# Patient Record
Sex: Male | Born: 1935 | Race: White | Hispanic: No | Marital: Married
Health system: Midwestern US, Academic
[De-identification: ages and names within clinical notes are randomized; demographics above are authoritative.]

---

## 2017-06-25 LAB — CBC
Lab: 13 — ABNORMAL LOW (ref 14.0–18.0)
Lab: 39 — ABNORMAL LOW (ref 42.0–52.0)
Lab: 4.2 — ABNORMAL LOW (ref 4.70–6.10)
Lab: 6.1
Lab: 93

## 2017-06-25 LAB — COMPREHENSIVE METABOLIC PANEL: Lab: 137 — ABNORMAL HIGH (ref 27.0–31.0)

## 2017-06-25 LAB — THYROID STIMULATING HORMONE-TSH: Lab: 1.4 — ABNORMAL LOW (ref 3.5–5.1)

## 2017-06-25 LAB — LACTIC ACID(LACTATE): Lab: 1.3

## 2017-06-29 LAB — BASIC METABOLIC PANEL
Lab: 10
Lab: 100
Lab: 112 — ABNORMAL HIGH (ref 98–107)
Lab: 141 — ABNORMAL LOW (ref 23–31)
Lab: 15 — ABNORMAL HIGH (ref 0–14)
Lab: 18 — ABNORMAL LOW (ref 23–31)
Lab: 87
Lab: 9.6 — ABNORMAL LOW (ref 3.4–4.8)
Lab: 96

## 2017-06-29 LAB — PSA SCREEN

## 2017-07-28 ENCOUNTER — Encounter: Admit: 2017-07-28 | Discharge: 2017-07-28 | Payer: MEDICARE

## 2017-08-11 ENCOUNTER — Encounter: Admit: 2017-08-11 | Discharge: 2017-08-11 | Payer: MEDICARE

## 2017-08-11 ENCOUNTER — Ambulatory Visit: Admit: 2017-08-11 | Discharge: 2017-08-12 | Payer: MEDICARE

## 2017-08-11 DIAGNOSIS — I2510 Atherosclerotic heart disease of native coronary artery without angina pectoris: ICD-10-CM

## 2017-08-11 DIAGNOSIS — Z8249 Family history of ischemic heart disease and other diseases of the circulatory system: ICD-10-CM

## 2017-08-11 DIAGNOSIS — I739 Peripheral vascular disease, unspecified: Principal | ICD-10-CM

## 2017-08-11 DIAGNOSIS — E039 Hypothyroidism, unspecified: Principal | ICD-10-CM

## 2017-08-11 DIAGNOSIS — C61 Malignant neoplasm of prostate: ICD-10-CM

## 2017-08-11 DIAGNOSIS — I10 Essential (primary) hypertension: ICD-10-CM

## 2017-08-11 MED ORDER — EZETIMIBE 10 MG PO TAB
10 mg | ORAL_TABLET | Freq: Every day | ORAL | 3 refills | Status: AC
Start: 2017-08-11 — End: 2018-07-09

## 2017-08-11 NOTE — Progress Notes
Date of Service: 08/11/2017    Aaron Fuentes is a 82 y.o. male.       HPI      Aaron Fuentes presents for routine annual evaluation of his cardiovascular risks in the setting where he is never had any intervention.  His most clear-cut vascular disease has been in his legs were ultrasound showed occluded distal anterior tib and DP arteries are on the left with fairly satisfactory flow on the right.  He saw vascular surgeon Zeb Comfort 6 years ago it is in [place] and was started on Pletal.  My notes indicated symptoms improved.    Since I last seen Aaron Fuentes is not had hospitalizations for vascular disease.  He is fairly satisfied with his quality of life is not having chest pain.  When I ask him about whether he was still walking 10,000 steps is documented on his Fitbit he told me that he cut back because of pain in his legs that he indicates is in the calves.  States he also has some sciatica with back pain as well but the principal feature he mentioned first was the calf pain that he thinks is a little worse on the right than the left.  He continues to take the Pletal that was started 6 years ago.     08/07/2016    Cholesterol 182   Triglycerides 92   HDL 43   LDL 123 (H)    He denies having typical symptoms suggesting the presence of angina, heart failure, TIA, claudication or arrhythmias.  He specifically denies having any sense of cardiac rate or rhythm abnormality and no sense that his heart is beating in any manner that is different than in the past.          Vitals:    08/11/17 1359 08/11/17 1403   BP: 120/72 120/74   Pulse:  71   Weight: 84.4 kg (186 lb)    Height: 1.778 m (5' 10)      Body mass index is 26.69 kg/m???.     Past Medical History  Patient Active Problem List    Diagnosis Date Noted   ??? Bilateral carotid artery disease (HCC) 01/22/2016     03/22/25 Carotid ultrasound  [facility]: mild disease, uncharged from prior     ??? Extremity atherosclerosis with intermittent claudication (HCC) 02/25/2011     11/21/10 Bilat leg [place] [facility]: Right: Minimal reduction distal flow. Occluded L SFA w/ popliteal reconstitution. Occluded distal anterior Tib and DP arteries. Pletal tried, Dr Yetta Flock w/ improvement.      ??? Coronary artery disease due to lipid rich plaque 04/10/2009     a.  7/96 Dobut echo EF 60%, Poss inf hypo at rest, normal w. dobutamine all regions  b. 12/00-[facility]allium: c/w limited inferior MI, EF 53%, lung/heart count 0.52, slightly high  c. 2001Cath. in [place] - mild single vessel disease, probably RCA,  d. 3/02, [facility] stress echo, 8 min, 85% HR, 55% EF, No ischemia     ??? HTN (hypertension) 04/10/2009   ??? Hyperlipidemia 04/10/2009     a. 9/95 total 225 trig 129 HDL 27    LDL 172 baseline  b. 12/00 total 169 trig 189 HDL 34  LDL 97 Lipitor 10  c. 08/04/07 total 143 trig 80 HDL 36 LDLd  93 Lipitor 10 Niaspan1000  d. 12/21/07 total 136 trig 77 HDL 49, LDL 72 lipitor 10 niaspan 9323  E. 01/24/09 Total 144 trig 80 HDL 42 LDL  80 Lipitor 10, Niaspan 2 gm, increase lipitor to 40      ??? Hypothyroidism 04/10/2009     a. 12/00 TSH elevated 5.9, then 2/01 17.26 w/ free T4 borderline low 0.79     ??? Osteoarthritis 04/10/2009     a. 1999-Left knee replacement     ??? Prostate cancer (HCC) 04/10/2009     a. 9/00-prostatectomy  B. 2013: No recurrence, Dr. Larwance Rote.      ??? FHx: abdominal aortic aneurysm 04/10/2009      a. 08/02/07 Max diameter AA 2.0, normal           Review of Systems   Constitution: Negative.   HENT: Negative.    Eyes: Negative.    Cardiovascular: Negative.    Respiratory: Negative.    Endocrine: Negative.    Hematologic/Lymphatic: Negative.    Skin: Negative.    Musculoskeletal: Negative.    Gastrointestinal: Negative.    Genitourinary: Negative.    Neurological: Negative.    Psychiatric/Behavioral: Negative.    Allergic/Immunologic: Negative.        Physical Exam  General: Patient in no distress, looks healthy. Skin warm and dry, non icteric, Mucous membranes moist  Pupils equal and round  Carotids: no bruits    Thyroid not enlarged.  Neck veins: CVP < 6 normal, no V wave, no HJR     Respiratory: Breathing comfortably. Lungs clear to percussion & auscultation. No rales, rhonchi or wheezing   Cardiac: Regular rhythm. LV impulse not palpable. Normal S1 & S2, Fourth heart sound, no rub or S3. No murmur  Abdomen soft, non-tender, no masses or bruits, No hepatic or aortic enlargement, Nomral bowel sounds.   Femoral arteries: Femoral pulses are reduced bilaterally with soft bifemoral bruits.  Legs/feet: He has no edema.  I do not feel DP or PT pulses on the right but think he has a 1-2+ left DP.  I think is capillary refill is reduced on his right great toe compared to the left with a slightly cooler toes on the right.  Motor: Normal muscle strength.      Cognitive: Good insight. No depression, good cognitive function    Cardiovascular Studies  EKG today shows sinus rhythm left atrial enlargement inferior Q waves leads III and aVF.    Problems Addressed Today  No diagnosis found.    Assessment and Plan   I think Aaron Fuentes has claudication bilaterally with evidence on exam of peripheral arterial disease.  His disease was worse on the left than the right when last checked by ultrasound 6 years ago in [place].  He initially improved with Pletal but now his symptoms are significantly worse than they were when I saw him last year.  Aaron Fuentes is not that concerned about his symptoms as he is accepted the reduction in his walking capacity with without a great deal of distress.  Nevertheless he is interested in pursuing treatment if his exercise capacity could improve and his calf pain with exercise be diminished.    I recommended that he see 1 of our vascular disease specialist Dr. Adella Hare at [facility].  I think noninvasive imaging of some sort would be good at that time.  I mean ask him to set up an appointment with Dr. Chales Abrahams sometime next month and I will contact Dr. Chales Abrahams to determine what not what testing he would recommend for Aaron Fuentes.  Perhaps it is a CT angiogram or perhaps simply ultrasound.  For now I am going to defer  to Dr. Chales Abrahams for the next few diagnostic steps.    I am going to start him on Zetia since his LDL most recently was over 120 on max dose atorvastatin.      NB: This document was prepared within the Epic(TM) EMR using templates developed by the [facility]. It can be accessed online through [facility] feature by clinicians at other Epic institutions.  The free text in this document, was generated through Dragon(TM) software.  Editing and proofreading were done by the author of this document Dr. Mable Paris MD, [facility] principally at the point of care.  In spite of the author's best effort to identify every error introduced by voice to text dictation, some errors that may represent misspelling or misstatements of what was dictated may persist.  If there are questions about content in this document please contact Dr. Hale Bogus.    The written information I provided Aaron Fuentes at the conclusion of today's encounter is as  follows:   Patient Instructions   Your leg pain is not terrifically severe but it is limiting her activities.  Last year I was pleased to know that your walking 10,000 steps a day.  I am dismayed to hear that you are not able to do that this year because of the calf pain.  I believe that Pain that you are having is due to severely blocked arteries to your feet.  You been on Pletal for a number of years which is the only medicine that helps reduce those symptoms.  Now you are having symptoms while on Pletal it is time to move forward with better testing and possible treatment with balloon catheters or even surgery.  It looks to me like the blood flow to your feet is less than it should be so I want to make sure that we have done everything possible as soon as we can to keep the blood flow going to your feet.    Dr. Harley Alto at [facility] is the best Dr. I know up to for you to see about this.  I want you to set up an appointment with him in late October or early November which is about when he will have an opening.  I am not sure what testing he will want the will try to set that up to occur at the same time.    In the meantime I want you to add Zetia 10 mg daily to your program for reducing your cholesterol.  Do not stop your other medication for a cholesterol control atorvastatin when you start the Zetia.  It is time for you to take both.   That should get your bad cholesterol down further and keep the arteries from becoming more blocked.      Your blood pressure looks good so that is the only change I am making in your medications.  Please do continue to stay active.  I will see you back in [place] in about 3 months.  I think you will need to meet Dr. Chales Abrahams before then to sort out what can be done for your leg pain that is impairing your walking capacity.    I think it would be good for you to return for a recheck in six months. I can see you sooner if needed.   Call in if you have problems or questions.   Marissa Nestle, MD  Current Medications (including today's revisions)  ??? atorvastatin (LIPITOR) 80 mg tablet TAKE 1 TABLET EVERY DAY   ??? cilostazol(+) (PLETAL) 100 mg tablet Take 100 mg by mouth twice daily.     ??? diclofenac sodium (PENNSAID) 2 % sopk Apply  topically to affected area.   ??? ferrous gluconate 324 mg (37.5 mg iron) tab Take 324 mg by mouth daily with breakfast.   ??? levoFLOXacin (LEVAQUIN) 500 mg tablet Take 1 tablet by mouth daily.   ??? Levothyroxine 125 mcg Cap Take 1 Tab by mouth Daily.   ??? meloxicam,+, (MOBIC) 7.5 mg tablet Take 15 mg by mouth daily.   ??? naproxen sodium (ALEVE) 220 mg capsule Take 1 tablet by mouth twice daily. Take with food.   ??? prednisone (DELTASONE) 20 mg tablet Take 1 tablet by mouth daily. ??? Ramipril (ALTACE) 10 mg Tab Take 1 Tab by mouth Daily.   ??? VENTOLIN HFA 90 mcg/actuation inhaler Take 2 puffs by mouth twice daily.

## 2017-08-18 ENCOUNTER — Encounter: Admit: 2017-08-18 | Discharge: 2017-08-18 | Payer: MEDICARE

## 2017-08-18 DIAGNOSIS — I70219 Atherosclerosis of native arteries of extremities with intermittent claudication, unspecified extremity: ICD-10-CM

## 2017-08-18 DIAGNOSIS — I739 Peripheral vascular disease, unspecified: ICD-10-CM

## 2017-08-18 DIAGNOSIS — I2510 Atherosclerotic heart disease of native coronary artery without angina pectoris: Principal | ICD-10-CM

## 2017-08-18 DIAGNOSIS — M79609 Pain in unspecified limb: ICD-10-CM

## 2017-08-18 DIAGNOSIS — M79661 Pain in right lower leg: ICD-10-CM

## 2017-08-18 NOTE — Telephone Encounter
When we see him in November please do complete ABI and arterial duplex study. thanks

## 2017-09-30 ENCOUNTER — Ambulatory Visit: Admit: 2017-09-30 | Discharge: 2017-10-01 | Payer: MEDICARE

## 2017-09-30 ENCOUNTER — Encounter: Admit: 2017-09-30 | Discharge: 2017-09-30 | Payer: MEDICARE

## 2017-09-30 DIAGNOSIS — I10 Essential (primary) hypertension: ICD-10-CM

## 2017-09-30 DIAGNOSIS — E039 Hypothyroidism, unspecified: Principal | ICD-10-CM

## 2017-09-30 DIAGNOSIS — W19XXXA Unspecified fall, initial encounter: Principal | ICD-10-CM

## 2017-09-30 DIAGNOSIS — Z8249 Family history of ischemic heart disease and other diseases of the circulatory system: ICD-10-CM

## 2017-09-30 DIAGNOSIS — I2510 Atherosclerotic heart disease of native coronary artery without angina pectoris: ICD-10-CM

## 2017-09-30 DIAGNOSIS — I70219 Atherosclerosis of native arteries of extremities with intermittent claudication, unspecified extremity: ICD-10-CM

## 2017-09-30 DIAGNOSIS — C61 Malignant neoplasm of prostate: ICD-10-CM

## 2017-09-30 NOTE — Progress Notes
Date of Service: 09/30/2017    Aaron Fuentes is a 81 y.o. male.       HPI     I had the pleasure of seeing Aaron Fuentes today for evaluation of a recent fall.  Aaron Fuentes has a history of bilateral carotid artery disease, coronary artery disease due to lipid rich plaque hypertension, hyperlipidemia, hypothyroidism, osteoarthritis, prostate cancer post prostatectomy in 2000, COPD with prior tobacco use, and a family history of abdominal aortic aneurysm.  He most recently followed up with Dr. Hale Bogus August 11, 2017 and at that time he noted that for the previous 6 years taking Pletal therapy his peripheral vascular disease in his legs was remaining stable.  He had not been walking as much as he had in the past due to pain in his calves that was a little worse on the right than the left.  He was asked to follow-up with an ABI and arterial duplex of the legs if indicated indicated.  He was then to follow-up with her with Dr. Harley Alto for vascular evaluation.  Due to his elevated lipid profile Zetia was added to his atorvastatin 80 mg daily.    Mrs. Aaron Fuentes called our office to let us know that Aaron Fuentes fell recently and an appointment was scheduled to evaluate him.    Aaron Fuentes presents to the office today along with his wife.  She indicates that Aaron Fuentes's memory has not been good for quite some time.  She tells me that probably 3 months ago he was bent over to pick up some potatoes and lost his balance and fell forward.  He received a small bruise on his forehead.  About 10 days ago Aaron Fuentes's brother passed away from lung cancer.  They went to his funeral.  Therin was walking up the steps of the church and tripped and fell forward.  He received a few scratches but no other injuries.  Aaron Fuentes tells me that he has not had any dizziness or lightheadedness.  He tells me the first time he just lost his balance and the second time he tripped on the church steps.  His wife states that he was very stressed at the funeral and was probably dehydrated as he was not thinking about drinking liquids.    Aaron Fuentes does have a 20-25-year history of smoking and has COPD.  His heart rate today has ranged from 92-109.  He is also on prednisone 20 mg daily.  He does have an occasional cough that is nonproductive.  He has sock top edema bilaterally that is 1+.    Assessment and plan    1.  2 falls within the last 3 months.  His first fall was about 3 months ago when he was bending over to pick up potatoes.  He is quite sure he lost his balance.  He absolutely denies feeling lightheaded or dizzy.  The second fall was walking up steps about 10 days ago and he says he tripped on the step.  Again he absolutely denies feeling lightheaded or dizzy.  He does run a rather low blood pressure at 104/58.  He has been reminded to make sure he stays well hydrated.  If he does have any episodes of feeling lightheaded or dizzy he is been asked to call our office back.    2.  Hypertension treated with ramipril 10 mg daily.  Also with his peripheral vascular disease we hope to keep the ramipril on board.  Although his blood pressure is on the low  side he absolutely denies feeling lightheaded or dizzy.  If he does develop lightheadedness or dizziness we will decrease this dose.    3.  Peripheral vascular disease.  He is scheduled for ABIs and if abnormal follow-up arterial duplex of the legs on 09/18/2017 at her [facility] office.  He has been given instructions for this procedure today.    4.  Elevated heart rate.  His heart rates are running 92-109 today.  This may be due to his underlying COPD and prednisone.    We spent approximately 15 minutes of his 25-minute office visit discussing peripheral vascular disease and potential treatment.  We also discussed COPD causes and treatment.  We also discussed the importance of safety measures to prevent falls.    Thank you for the opportunity to participate in the care of this patient. Please feel free to call us if you have any questions or concerns    Loraine Leriche, APRN-C  DRB             Vitals:    09/30/17 1315 09/30/17 1321   BP: 108/58 104/58   Pulse:  109   Weight: 88 kg (194 lb)    Height: 1.778 m (5' 10)      Body mass index is 27.84 kg/m???.     Past Medical History  Patient Active Problem List    Diagnosis Date Noted   ??? Bilateral carotid artery disease (HCC) 01/22/2016     03/22/25 Carotid ultrasound  [facility]: mild disease, uncharged from prior     ??? Extremity atherosclerosis with intermittent claudication (HCC) 02/25/2011     11/21/10 Bilat leg [place] [facility]: Right: Minimal reduction distal flow. Occluded L SFA w/ popliteal reconstitution. Occluded distal anterior Tib and DP arteries. Pletal tried, Dr Yetta Flock w/ improvement.      ??? Coronary artery disease due to lipid rich plaque 04/10/2009     a.  7/96 Dobut echo EF 60%, Poss inf hypo at rest, normal w. dobutamine all regions  b. 12/00-[facility]ium: c/w limited inferior MI, EF 53%, lung/heart count 0.52, slightly high  c. 2001Cath. in [place] - mild single vessel disease, probably RCA,  d. 3/02, [facility] stress echo, 8 min, 85% HR, 55% EF, No ischemia     ??? HTN (hypertension) 04/10/2009   ??? Hyperlipidemia 04/10/2009     a. 9/95 total 225 trig 129 HDL 27    LDL 172 baseline  b. 12/00 total 169 trig 189 HDL 34  LDL 97 Lipitor 10  c. 08/04/07 total 143 trig 80 HDL 36 LDLd  93 Lipitor 10 Niaspan1000  d. 12/21/07 total 136 trig 77 HDL 49, LDL 72 lipitor 10 niaspan 9604  E. 01/24/09 Total 144 trig 80 HDL 42 LDL 80 Lipitor 10, Niaspan 2 gm, increase lipitor to 40      ??? Hypothyroidism 04/10/2009     a. 12/00 TSH elevated 5.9, then 2/01 17.26 w/ free T4 borderline low 0.79     ??? Osteoarthritis 04/10/2009     a. 1999-Left knee replacement     ??? Prostate cancer (HCC) 04/10/2009     a. 9/00-prostatectomy  B. 2013: No recurrence, Dr. Larwance Rote.      ??? FHx: abdominal aortic aneurysm 04/10/2009      a. 08/02/07 Max diameter AA 2.0, normal Review of Systems   Constitution: Positive for weight gain.   HENT: Negative.    Eyes: Negative.    Cardiovascular: Positive for claudication.   Respiratory: Positive for cough, shortness  of breath, snoring and wheezing.    Endocrine: Negative.    Hematologic/Lymphatic: Negative.    Skin: Negative.    Musculoskeletal: Positive for arthritis, back pain, falls, joint pain, joint swelling, muscle cramps, muscle weakness, myalgias and stiffness.   Gastrointestinal: Negative.    Genitourinary: Negative.    Neurological: Positive for difficulty with concentration and loss of balance.   Psychiatric/Behavioral: Positive for memory loss.   Allergic/Immunologic: Negative.    He denies having chest pain, palpitations, PND, orthopnea, lightheadedness, dizziness, pre-syncope, syncope,  wheezing,  sputum production, or hemoptysis, or edema. He uses no tobacco products.      Physical Exam       General Appearance: resting comfortably, no acute distress  Skin: warm, moist, no ulcers or xanthomas, few small healing scratches on backs of hands/wrists  Eyes: conjunctivae and lids normal, pupils are equal and round  Lips & Oral Mucosa: no pallor or cyanosis  Ear, Nose, Throat: No deformities  Neck Veins: neck veins are flat, neck veins are not distended  Thyroid: no nodules, masses, tenderness or enlargement  Chest Inspection: chest is normal in appearance  Respiratory Effort: breathing is unlabored, no respiratory distress  Auscultation/Percussion: lungs clear to auscultation, no rales, rhonchi, or wheezing  PMI: PMI not enlarged or displaced  Cardiac Rhythm: regular rhythm and normal rate  Cardiac Auscultation: Normal S1 & S2, no S3 or S4, no rub  Murmurs: no cardiac murmurs   Carotid Arteries: normal carotid upstroke bilaterally, no bruits  Pedal Pulses: absent symmetric pedal pulses, left foot sl blue, decreased capillary refill both toes  Lower Extremity Edema: bilateral sock top edema, 1+ Abdominal Exam: soft, non-tender, no masses, bowel sounds normal  Abdominal Aorta: nonpalpable abdominal aorta; no abdominal bruits  Liver & Spleen: no organomegaly  Gait & Station: normal balance and gait  Muscle Strength: normal strength and tone  Neurologic Exam: neurological assessment grossly intact  Orientation: oriented to time, place and person  Affect & Mood: appropriate and sustained affect  Other: moves all extremities              Problems Addressed Today  No diagnosis found.                  Current Medications (including today's revisions)  ??? atorvastatin (LIPITOR) 80 mg tablet TAKE 1 TABLET EVERY DAY   ??? cilostazol(+) (PLETAL) 100 mg tablet Take 100 mg by mouth twice daily.     ??? diclofenac sodium (PENNSAID) 2 % sopk Apply  topically to affected area.   ??? ezetimibe (ZETIA) 10 mg tablet Take one tablet by mouth daily.   ??? ferrous gluconate 324 mg (37.5 mg iron) tab Take 324 mg by mouth daily with breakfast.   ??? levoFLOXacin (LEVAQUIN) 500 mg tablet Take 1 tablet by mouth daily.   ??? Levothyroxine 125 mcg Cap Take 1 Tab by mouth Daily.   ??? meloxicam,+, (MOBIC) 7.5 mg tablet Take 15 mg by mouth daily.   ??? naproxen sodium (ALEVE) 220 mg capsule Take 1 tablet by mouth twice daily. Take with food.   ??? prednisone (DELTASONE) 20 mg tablet Take 1 tablet by mouth daily.   ??? Ramipril (ALTACE) 10 mg Tab Take 1 Tab by mouth Daily.   ??? VENTOLIN HFA 90 mcg/actuation inhaler Take 2 puffs by mouth twice daily.

## 2017-10-07 ENCOUNTER — Ambulatory Visit: Admit: 2017-10-07 | Discharge: 2017-10-08 | Payer: MEDICARE

## 2017-10-07 DIAGNOSIS — I739 Peripheral vascular disease, unspecified: ICD-10-CM

## 2017-10-07 DIAGNOSIS — I70219 Atherosclerosis of native arteries of extremities with intermittent claudication, unspecified extremity: ICD-10-CM

## 2017-10-07 DIAGNOSIS — M79661 Pain in right lower leg: Principal | ICD-10-CM

## 2017-10-07 DIAGNOSIS — M79609 Pain in unspecified limb: ICD-10-CM

## 2017-10-07 DIAGNOSIS — I2510 Atherosclerotic heart disease of native coronary artery without angina pectoris: ICD-10-CM

## 2017-10-16 ENCOUNTER — Encounter: Admit: 2017-10-16 | Discharge: 2017-10-16 | Payer: MEDICARE

## 2017-10-16 NOTE — Telephone Encounter
Message sent to scheduling to work patient in on 12/18 at [facility] with [place].

## 2017-10-16 NOTE — Telephone Encounter
-----   Message from Harle Battiest, APRN-C sent at 10/16/2017  6:41 AM CST -----  Regarding: RE: Question  Kaitlin,  Although he was not having toe or foot pain, he did occasionally have some calf pain previously. If you can get him in to see KAG sooner that would be best.  Thank you,  Collie Siad  ----- Message -----  From: Aundria Rud, RN  Sent: 10/15/2017   9:46 AM  To: Aundria Rud, RN, Harle Battiest, APRN-C  Subject: Question                                         Carmel [place],    This patient is scheduled with Dr. Lyndel Safe on 11/19/17.      Dr. Lyndel Safe would like to know if he is having rest pain in toes or foot wounds.  If yes, then we will try to get him in earlier. If no, we will keep him on for January as then there is no urgency.      Let me know! Thanks!     Kait    ----- Message -----  From: Silvestre Moment, MD  Sent: 10/12/2017   8:06 AM  To: Erle Crocker, MD, Aundria Rud, RN, #    Thank you for the message,   Collie Siad, if Dr. Lyndel Safe is suggesting that he is worked in before 5 weeks from now, will someone contact him?     ----- Message -----  From: Erle Crocker, MD  Sent: 10/09/2017   2:25 PM  To: Silvestre Moment, MD, Aundria Rud, RN, #    Eduard Clos and Collie Siad    This patient's duplex studies show quite significant disease that is alos multilevel and diffuse.    He cancelled an appointment with me earlier this month but has another one in early January.    If he does not have critical limb ischemia ( rest pain in toes or foot wounds) then there is certainly no urgency to be seen but just wanted to make sure as we can always work patients in.    Thanks    [employer]

## 2017-10-20 ENCOUNTER — Encounter: Admit: 2017-10-20 | Discharge: 2017-10-21 | Payer: MEDICARE

## 2017-10-20 DIAGNOSIS — R69 Illness, unspecified: Principal | ICD-10-CM

## 2017-11-03 ENCOUNTER — Ambulatory Visit: Admit: 2017-11-03 | Discharge: 2017-11-04 | Payer: MEDICARE

## 2017-11-03 ENCOUNTER — Encounter: Admit: 2017-11-03 | Discharge: 2017-11-03 | Payer: MEDICARE

## 2017-11-03 DIAGNOSIS — Z8249 Family history of ischemic heart disease and other diseases of the circulatory system: ICD-10-CM

## 2017-11-03 DIAGNOSIS — I70213 Atherosclerosis of native arteries of extremities with intermittent claudication, bilateral legs: Principal | ICD-10-CM

## 2017-11-03 DIAGNOSIS — I739 Peripheral vascular disease, unspecified: Principal | ICD-10-CM

## 2017-11-03 DIAGNOSIS — I10 Essential (primary) hypertension: ICD-10-CM

## 2017-11-03 DIAGNOSIS — Z01812 Encounter for preprocedural laboratory examination: ICD-10-CM

## 2017-11-03 DIAGNOSIS — E039 Hypothyroidism, unspecified: Principal | ICD-10-CM

## 2017-11-03 DIAGNOSIS — I2510 Atherosclerotic heart disease of native coronary artery without angina pectoris: ICD-10-CM

## 2017-11-03 DIAGNOSIS — C61 Malignant neoplasm of prostate: ICD-10-CM

## 2017-11-03 MED ORDER — ASPIRIN 325 MG PO TAB
325 mg | Freq: Once | ORAL | 0 refills | Status: CN
Start: 2017-11-03 — End: ?

## 2017-11-03 MED ORDER — SODIUM CHLORIDE 0.9 % IV SOLP
250 mL | INTRAVENOUS | 0 refills | Status: CN | PRN
Start: 2017-11-03 — End: ?

## 2017-11-03 NOTE — Progress Notes
Date of Service: 11/03/2017    Aaron Fuentes is a 81 y.o. male.       HPI     Aaron Fuentes comes today to establish vascular care.  He is a remarkably pleasant 32 year old gentleman who follows with my colleague Dr. Marissa Nestle.  He is seeing me today for worsening lower extremity discomfort consistent with vascular.  He and his wife both agree that the patient is getting progressively able to do less and less.  A year ago they would easily walk a mile when the claudication would set in but now they have stopped walking very much and he has to stop after 1 or 2 blocks because of discomfort.  He describes discomfort in both calves and right hip when he walks.  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 Yetta Flock in [place].  At that time Pletal brought about some relief and conservative management was pursued.  More recently    For worsening symptoms the patient underwent resting and exercise ABI and duplex sonography requested by Dr. Hale Bogus.  His resting ABI on the right side was 0.6 and became 0 after limited amount of walking on the treadmill. Left leg resting ABI was 0.74 and reduced to 0.63 on activity.  He had severe stenosis in the right  femoral artery the monophasic waveforms even above that suggest that there might also be an iliac artery stenosis that is being missed.  The left SFA is occluded.  The patient has no nonhealing ulcers or sores or rest discomfort to suggest critical limb ischemia      Past medical history:    1. Peripheral arterial disease with severe stenosis of the right CFA and occlusion of the left SFA  2. Hypothyroidism  3. Left knee replacement in 1999  4. Prostatectomy for prostate cancer in 2000  5. Hypertension  6. Mild CAD without prior revascularization    Social history: The patient is not a current smoker and does not use alcohol in any significant quantity Family history: History of abdominal aortic aneurysm           Vitals:    11/03/17 1457 11/03/17 1506   BP: 126/70 130/70   Pulse: 80    SpO2: 95%    Weight: 85.7 kg (189 lb)    Height: 1.778 m (5' 10)      Body mass index is 27.12 kg/m???.     Past Medical History  Patient Active Problem List    Diagnosis Date Noted   ??? Falls 09/30/2017   ??? Bilateral carotid artery disease (HCC) 01/22/2016     03/22/25 Carotid ultrasound  [facility]: mild disease, uncharged from prior     ??? Extremity atherosclerosis with intermittent claudication (HCC) 02/25/2011     11/21/10 Bilat leg [place] [facility]: Right: Minimal reduction distal flow. Occluded L SFA w/ popliteal reconstitution. Occluded distal anterior Tib and DP arteries. Pletal tried, Dr Yetta Flock w/ improvement.      ??? Coronary artery disease due to lipid rich plaque 04/10/2009     a.  7/96 Dobut echo EF 60%, Poss inf hypo at rest, normal w. dobutamine all regions  b. 12/00-[facility] thallium: c/w limited inferior MI, EF 53%, lung/heart count 0.52, slightly high  c. 2001Cath. in [place] - mild single vessel disease, probably RCA,  d. 3/02, [facility] stress echo, 8 min, 85% HR, 55% EF, No ischemia     ???  HTN (hypertension) 04/10/2009   ??? Hyperlipidemia 04/10/2009     a. 9/95 total 225 trig 129 HDL 27    LDL 172 baseline  b. 12/00 total 169 trig 189 HDL 34  LDL 97 Lipitor 10  c. 08/04/07 total 143 trig 80 HDL 36 LDLd  93 Lipitor 10 Niaspan1000  d. 12/21/07 total 136 trig 77 HDL 49, LDL 72 lipitor 10 niaspan 1610  E. 01/24/09 Total 144 trig 80 HDL 42 LDL 80 Lipitor 10, Niaspan 2 gm, increase lipitor to 40      ??? Hypothyroidism 04/10/2009     a. 12/00 TSH elevated 5.9, then 2/01 17.26 w/ free T4 borderline low 0.79     ??? Osteoarthritis 04/10/2009     a. 1999-Left knee replacement     ??? Prostate cancer (HCC) 04/10/2009     a. 9/00-prostatectomy  B. 2013: No recurrence, Dr. Larwance Rote.      ??? FHx: abdominal aortic aneurysm 04/10/2009      a. 08/02/07 Max diameter AA 2.0, normal Review of Systems   Constitution: Negative.   HENT: Positive for hearing loss.    Eyes: Negative.    Cardiovascular: Positive for claudication, dyspnea on exertion and leg swelling.   Respiratory: Positive for cough, shortness of breath, snoring and wheezing.    Endocrine: Negative.    Hematologic/Lymphatic: Bruises/bleeds easily.   Skin: Negative.    Musculoskeletal: Positive for arthritis, back pain, falls, muscle cramps and stiffness.   Gastrointestinal: Negative.    Genitourinary: Negative.    Neurological: Positive for loss of balance.   Psychiatric/Behavioral: Negative.    Allergic/Immunologic: Negative.        Physical Exam    Physical Exam  General Appearance: no acute distress   Skin: warm, moist, no ulcers   HENT: unremarkable   Eyes; Pupils are round and reactive. No icterus  Neck Veins: neck veins are flat, neck veins are not distended   Carotid Arteries: normal carotid upstroke bilaterally, no bruits   Chest Inspection: chest is normal in appearance   Auscultation/Percussion: lungs clear to auscultation, no rales, rhonchi, or wheezing   Cardiac Rhythm: regular rhythm and normal rate   Cardiac Auscultation: Normal S1 & S2, no S3 or S4, no rub   Murmurs: no cardiac murmurs   Extremities: Pulses:  Pulses: (Scale 4= normal, 0= absent)    Right   Femoral: 4+, Dorsalis Pedis: 0+,  Posterior Tibial: 0+  Left   Femoral: 4+, Dorsalis Pedis: 0+,  Posterior Tibial: 0+   No pedal edema.  Abdominal Exam: soft, non-tender, no masses, bowel sounds normal   Liver & Spleen: no organomegaly   Neurologic Exam: neurological assessment grossly intact        Problems Addressed Today  Encounter Diagnoses   Name Primary?   ??? Extremity atherosclerosis with intermittent claudication (HCC) Yes       Assessment and Plan     In summary the patient has worsening bilateral vascular claudication and abnormal ABI and duplex studies as detailed above.  I had a long discussion with the patient and his wife and we have decided to proceed with invasive angiography rather than CT angiogram at this time.  They understand that determination of revascularization options would be made once anatomy is defined with angiography.  He is currently taking cilostazol which I would likely stop if we do revascularize.  He is on optimal risk reduction regimen under the care of Dr. Hale Bogus and I make no changes to his regimen at this  time.  I forgot to ask him if he was indeed taking an aspirin but I do not see it on his medication list and if he is not we will start him on that when I see him for the procedure.             Current Medications (including today's revisions)  ??? atorvastatin (LIPITOR) 80 mg tablet TAKE 1 TABLET EVERY DAY   ??? cilostazol(+) (PLETAL) 100 mg tablet Take 100 mg by mouth twice daily.     ??? diclofenac sodium (PENNSAID) 2 % sopk Apply  topically to affected area.   ??? ezetimibe (ZETIA) 10 mg tablet Take one tablet by mouth daily.   ??? ferrous gluconate 324 mg (37.5 mg iron) tab Take 324 mg by mouth daily with breakfast.   ??? Levothyroxine 125 mcg Cap Take 1 Tab by mouth Daily.   ??? meloxicam (MOBIC) 15 mg tablet Take 15 mg by mouth daily.   ??? Ramipril (ALTACE) 10 mg Tab Take 1 Tab by mouth Daily.

## 2017-11-04 ENCOUNTER — Encounter: Admit: 2017-11-04 | Discharge: 2017-11-04 | Payer: MEDICARE

## 2017-11-04 DIAGNOSIS — C61 Malignant neoplasm of prostate: ICD-10-CM

## 2017-11-04 DIAGNOSIS — J449 Chronic obstructive pulmonary disease, unspecified: ICD-10-CM

## 2017-11-04 DIAGNOSIS — Z87891 Personal history of nicotine dependence: ICD-10-CM

## 2017-11-04 DIAGNOSIS — E039 Hypothyroidism, unspecified: Principal | ICD-10-CM

## 2017-11-04 DIAGNOSIS — I10 Essential (primary) hypertension: ICD-10-CM

## 2017-11-04 DIAGNOSIS — Z974 Presence of external hearing-aid: ICD-10-CM

## 2017-11-04 DIAGNOSIS — E785 Hyperlipidemia, unspecified: ICD-10-CM

## 2017-11-04 DIAGNOSIS — I739 Peripheral vascular disease, unspecified: ICD-10-CM

## 2017-11-04 DIAGNOSIS — Z8249 Family history of ischemic heart disease and other diseases of the circulatory system: ICD-10-CM

## 2017-11-04 DIAGNOSIS — M1990 Unspecified osteoarthritis, unspecified site: ICD-10-CM

## 2017-11-04 DIAGNOSIS — I2510 Atherosclerotic heart disease of native coronary artery without angina pectoris: ICD-10-CM

## 2017-11-04 NOTE — Telephone Encounter
Initial Visit Date: 11/25/17    Reason for Visit: dementia w/ behavioral problems    Is this as a result of an injury? No    Physician Information  PCP: Dr. Jaclyn Shaggy  Referring Physician: Dr. Earvin Hansen [facility]  Previous Neurologist:   Other providers seen: Dr. Lenoard Aden & Dr. Erle Crocker [place] [facility]    Previous Imaging/Procedures:  MRI head- 03/23/15 [facility]- have report  CT   XRay   EMG   EEG   US carotid- 03/23/15 [facility]- have report    ED/Hospitalizations:      Current Meds:  none     Medications, Allergies, History Updated

## 2017-11-20 ENCOUNTER — Encounter: Admit: 2017-11-20 | Discharge: 2017-11-20 | Payer: MEDICARE

## 2017-11-24 LAB — CBC
Lab: 13
Lab: 15
Lab: 32 — ABNORMAL LOW (ref 33.0–37.0)
Lab: 4.8
Lab: 46
Lab: 7.7
Lab: 95

## 2017-11-24 LAB — BASIC METABOLIC PANEL: Lab: 136

## 2017-11-25 ENCOUNTER — Encounter: Admit: 2017-11-25 | Discharge: 2017-11-25 | Payer: MEDICARE

## 2017-11-25 ENCOUNTER — Ambulatory Visit: Admit: 2017-11-25 | Discharge: 2017-11-26 | Payer: MEDICARE

## 2017-11-25 DIAGNOSIS — E039 Hypothyroidism, unspecified: Principal | ICD-10-CM

## 2017-11-25 DIAGNOSIS — Z8249 Family history of ischemic heart disease and other diseases of the circulatory system: ICD-10-CM

## 2017-11-25 DIAGNOSIS — I10 Essential (primary) hypertension: ICD-10-CM

## 2017-11-25 DIAGNOSIS — Z01812 Encounter for preprocedural laboratory examination: ICD-10-CM

## 2017-11-25 DIAGNOSIS — Z974 Presence of external hearing-aid: ICD-10-CM

## 2017-11-25 DIAGNOSIS — J449 Chronic obstructive pulmonary disease, unspecified: ICD-10-CM

## 2017-11-25 DIAGNOSIS — I739 Peripheral vascular disease, unspecified: Secondary | ICD-10-CM

## 2017-11-25 DIAGNOSIS — C61 Malignant neoplasm of prostate: ICD-10-CM

## 2017-11-25 DIAGNOSIS — M1990 Unspecified osteoarthritis, unspecified site: ICD-10-CM

## 2017-11-25 DIAGNOSIS — E785 Hyperlipidemia, unspecified: ICD-10-CM

## 2017-11-25 DIAGNOSIS — I2510 Atherosclerotic heart disease of native coronary artery without angina pectoris: ICD-10-CM

## 2017-11-25 MED ORDER — DONEPEZIL 5 MG PO TAB
5 mg | ORAL_TABLET | Freq: Every evening | ORAL | 5 refills | 90.00000 days | Status: AC
Start: 2017-11-25 — End: 2018-07-21

## 2017-11-26 DIAGNOSIS — R4189 Other symptoms and signs involving cognitive functions and awareness: Principal | ICD-10-CM

## 2017-11-30 ENCOUNTER — Encounter: Admit: 2017-11-30 | Discharge: 2017-12-01 | Payer: MEDICARE

## 2017-11-30 ENCOUNTER — Encounter: Admit: 2017-11-30 | Discharge: 2017-11-30 | Payer: MEDICARE

## 2017-11-30 DIAGNOSIS — E7849 Other hyperlipidemia: ICD-10-CM

## 2017-11-30 DIAGNOSIS — R69 Illness, unspecified: Principal | ICD-10-CM

## 2017-11-30 DIAGNOSIS — I739 Peripheral vascular disease, unspecified: Principal | ICD-10-CM

## 2017-11-30 MED ORDER — ACETAMINOPHEN 325 MG PO TAB
650 mg | ORAL | 0 refills | Status: CN | PRN
Start: 2017-11-30 — End: ?

## 2017-11-30 MED ORDER — ALUMINUM-MAGNESIUM HYDROXIDE 200-200 MG/5 ML PO SUSP
30 mL | ORAL | 0 refills | Status: CN | PRN
Start: 2017-11-30 — End: ?

## 2017-11-30 MED ORDER — DOCUSATE SODIUM 100 MG PO CAP
100 mg | Freq: Every day | ORAL | 0 refills | Status: CN | PRN
Start: 2017-11-30 — End: ?

## 2017-11-30 MED ORDER — LIDOCAINE (PF) 10 MG/ML (1 %) IJ SOLN
.1-2 mL | Freq: Once | INTRAMUSCULAR | 0 refills | Status: CN
Start: 2017-11-30 — End: ?

## 2017-11-30 MED ORDER — SODIUM CHLORIDE 0.9 % IV SOLP
INTRAVENOUS | 0 refills | Status: CN
Start: 2017-11-30 — End: ?

## 2017-11-30 NOTE — H&P (View-Only)
Patient presents for procedure. Please see History and Physical copied below from date of service 11/03/17.    Suzy Bouchard, PAC [phone])      Progress Notes   Harley Alto, MD (Physician) ??? ??? Cardiology ??? ??? 11/03/17 1545 ??? ??? Signed      Date of Service: 11/03/2017  ???  Aaron Fuentes is a 83 y.o. male.     ???  HPI  Aaron Fuentes comes today to establish vascular care.  He is a remarkably pleasant 23 year old gentleman who follows with my colleague Dr. Marissa Nestle.  He is seeing me today for worsening lower extremity discomfort consistent with vascular.  He and his wife both agree that the patient is getting progressively able to do less and less.  A year ago they would easily walk a mile when the claudication would set in but now they have stopped walking very much and he has to stop after 1 or 2 blocks because of discomfort.  He describes discomfort in both calves and right hip when he walks.  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 Yetta Flock in [place].  At that time Pletal brought about some relief and conservative management was pursued.  More recently  ???  For worsening symptoms the patient underwent resting and exercise ABI and duplex sonography requested by Dr. Hale Bogus.  His resting ABI on the right side was 0.6 and became 0 after limited amount of walking on the treadmill. Left leg resting ABI was 0.74 and reduced to 0.63 on activity.  He had severe stenosis in the right  femoral artery the monophasic waveforms even above that suggest that there might also be an iliac artery stenosis that is being missed.  The left SFA is occluded.  The patient has no nonhealing ulcers or sores or rest discomfort to suggest critical limb ischemia  ???  ???  Past medical history:  ???  1. Peripheral arterial disease with severe stenosis of the right CFA and occlusion of the left SFA  2. Hypothyroidism  3. Left knee replacement in 1999 4. Prostatectomy for prostate cancer in 2000  5. Hypertension  6. Mild CAD without prior revascularization  ???  Social history: The patient is not a current smoker and does not use alcohol in any significant quantity  ???  Family history: History of abdominal aortic aneurysm  ???  ???  ???  Vitals:   ??? 11/03/17 1457 11/03/17 1506   BP: 126/70 130/70   Pulse: 80 ???   SpO2: 95% ???   Weight: 85.7 kg (189 lb) ???   Height: 1.778 m (5' 10) ???   ???  Body mass index is 27.12 kg/m???.   ???  Past Medical History        Patient Active Problem List   ??? Diagnosis Date Noted   ??? Falls 09/30/2017   ??? Bilateral carotid artery disease (HCC) 01/22/2016   ??? ??? 03/22/25 Carotid ultrasound  [facility]: mild disease, uncharged from prior  ???   ??? Extremity atherosclerosis with intermittent claudication (HCC) 02/25/2011   ??? ??? 11/21/10 Bilat leg [place] [facility]: Right: Minimal reduction distal flow. Occluded L SFA w/ popliteal reconstitution. Occluded distal anterior Tib and DP arteries. Pletal tried, Dr Yetta Flock w/ improvement.   ???   ??? Coronary artery disease due to lipid rich plaque 04/10/2009   ??? ??? a.  7/96 Dobut echo EF 60%, Poss inf  hypo at rest, normal w. dobutamine all regions  b. 12/00-[facility]ium: c/w limited inferior MI, EF 53%, lung/heart count 0.52, slightly high  c. 2001Cath. in [place] - mild single vessel disease, probably RCA,  d. 3/02, [facility] stress echo, 8 min, 85% HR, 55% EF, No ischemia  ???   ??? HTN (hypertension) 04/10/2009   ??? Hyperlipidemia 04/10/2009   ??? ??? a. 9/95 total 225 trig 129 HDL 27    LDL 172 baseline  b. 12/00 total 169 trig 189 HDL 34  LDL 97 Lipitor 10  c. 08/04/07 total 143 trig 80 HDL 36 LDLd  93 Lipitor 10 Niaspan1000  d. 12/21/07 total 136 trig 77 HDL 49, LDL 72 lipitor 10 niaspan 1610  E. 01/24/09 Total 144 trig 80 HDL 42 LDL 80 Lipitor 10, Niaspan 2 gm, increase lipitor to 40   ???   ??? Hypothyroidism 04/10/2009   ??? ??? a. 12/00 TSH elevated 5.9, then 2/01 17.26 w/ free T4 borderline low 0.79  ??? ??? Osteoarthritis 04/10/2009   ??? ??? a. 1999-Left knee replacement  ???   ??? Prostate cancer (HCC) 04/10/2009   ??? ??? a. 9/00-prostatectomy  B. 2013: No recurrence, Dr. Larwance Rote.   ???   ??? FHx: abdominal aortic aneurysm 04/10/2009   ??? ???  a. 08/02/07 Max diameter AA 2.0, normal  ???   ???  ???  No Known Allergies  Social History     Socioeconomic History   ??? Marital status: Married     Spouse name: Not on file   ??? Number of children: Not on file   ??? Years of education: Not on file   ??? Highest education level: Not on file   Occupational History   ??? Not on file   Tobacco Use   ??? Smoking status: Former Smoker     Types: Cigarettes     Last attempt to quit: 07/08/1996     Years since quitting: 21.4   ??? Smokeless tobacco: Never Used   Substance and Sexual Activity   ??? Alcohol use: Yes     Alcohol/week: 8.4 oz     Types: 14 Cans of beer per week   ??? Drug use: No   ??? Sexual activity: Not on file   Other Topics Concern   ??? Not on file   Social History Narrative   ??? Not on file     Past Surgical History:   Procedure Laterality Date   ??? PROSTATECTOMY  08/02/1999    w/ bilateral pelvic lymph nodes   ??? COLONOSCOPY  10/02/2010   ??? CATARACT REMOVAL Left 12/2012   ??? INGUINAL HERNIA REPAIR Left     x2   ??? KNEE REPLACEMENT Left      Family History   Problem Relation Age of Onset   ??? Sudden Cardiac Death Mother    ??? Cancer-Breast Mother    ??? Cancer-Breast Sister    ??? Cancer Brother         throat   ??? Liver Cancer Brother        ???  Review of Systems   Constitution: Negative.   HENT: Positive for hearing loss.    Eyes: Negative.    Cardiovascular: Positive for claudication, dyspnea on exertion and leg swelling.   Respiratory: Positive for cough, shortness of breath, snoring and wheezing.    Endocrine: Negative.    Hematologic/Lymphatic: Bruises/bleeds easily.   Skin: Negative.    Musculoskeletal: Positive for arthritis, back pain, falls, muscle cramps and stiffness.  Gastrointestinal: Negative.    Genitourinary: Negative. Neurological: Positive for loss of balance.   Psychiatric/Behavioral: Negative.    Allergic/Immunologic: Negative.    ???  ???  Physical Exam  ???  Physical Exam  General Appearance: no acute distress   Skin: warm, moist, no ulcers   HENT: unremarkable   Eyes; Pupils are round and reactive. No icterus  Neck Veins: neck veins are flat, neck veins are not distended   Carotid Arteries: normal carotid upstroke bilaterally, no bruits   Chest Inspection: chest is normal in appearance   Auscultation/Percussion: lungs clear to auscultation, no rales, rhonchi, or wheezing   Cardiac Rhythm: regular rhythm and normal rate   Cardiac Auscultation: Normal S1 & S2, no S3 or S4, no rub   Murmurs: no cardiac murmurs   Extremities: Pulses:  Pulses: (Scale 4= normal, 0= absent)  ???  Right   Femoral: 4+, Dorsalis Pedis: 0+,  Posterior Tibial: 0+  Left   Femoral: 4+, Dorsalis Pedis: 0+,  Posterior Tibial: 0+   No pedal edema.  Abdominal Exam: soft, non-tender, no masses, bowel sounds normal   Liver & Spleen: no organomegaly   Neurologic Exam: neurological assessment grossly intact  ???  ???  ???  Problems Addressed Today       Encounter Diagnoses   Name Primary?   ??? Extremity atherosclerosis with intermittent claudication (HCC) Yes   ???  ???  Assessment and Plan  In summary the patient has worsening bilateral vascular claudication and abnormal ABI and duplex studies as detailed above.  I had a long discussion with the patient and his wife and we have decided to proceed with invasive angiography rather than CT angiogram at this time.  They understand that determination of revascularization options would be made once anatomy is defined with angiography.  He is currently taking cilostazol which I would likely stop if we do revascularize.  He is on optimal risk reduction regimen under the care of Dr. Hale Bogus and I make no changes to his regimen at this time.  I forgot to ask him if he was indeed taking an aspirin but I do not see it on his medication list and if he is not we will start him on that when I see him for the procedure.  ???  ???  ???  ???  Current Medications (including today's revisions)  ??? atorvastatin (LIPITOR) 80 mg tablet TAKE 1 TABLET EVERY DAY   ??? cilostazol(+) (PLETAL) 100 mg tablet Take 100 mg by mouth twice daily.     ??? diclofenac sodium (PENNSAID) 2 % sopk Apply  topically to affected area.   ??? ezetimibe (ZETIA) 10 mg tablet Take one tablet by mouth daily.   ??? ferrous gluconate 324 mg (37.5 mg iron) tab Take 324 mg by mouth daily with breakfast.   ??? Levothyroxine 125 mcg Cap Take 1 Tab by mouth Daily.   ??? meloxicam (MOBIC) 15 mg tablet Take 15 mg by mouth daily.   ??? Ramipril (ALTACE) 10 mg Tab Take 1 Tab by mouth Daily.   ???  ???

## 2017-12-01 ENCOUNTER — Encounter: Admit: 2017-12-01 | Discharge: 2017-12-01 | Payer: MEDICARE

## 2017-12-01 DIAGNOSIS — R4189 Other symptoms and signs involving cognitive functions and awareness: Principal | ICD-10-CM

## 2017-12-02 ENCOUNTER — Encounter: Admit: 2017-12-02 | Discharge: 2017-12-02 | Payer: MEDICARE

## 2017-12-02 ENCOUNTER — Ambulatory Visit: Admit: 2017-12-02 | Discharge: 2017-12-02 | Payer: MEDICARE

## 2017-12-02 DIAGNOSIS — M1990 Unspecified osteoarthritis, unspecified site: ICD-10-CM

## 2017-12-02 DIAGNOSIS — J449 Chronic obstructive pulmonary disease, unspecified: ICD-10-CM

## 2017-12-02 DIAGNOSIS — Z8249 Family history of ischemic heart disease and other diseases of the circulatory system: ICD-10-CM

## 2017-12-02 DIAGNOSIS — I70213 Atherosclerosis of native arteries of extremities with intermittent claudication, bilateral legs: Principal | ICD-10-CM

## 2017-12-02 DIAGNOSIS — E7849 Other hyperlipidemia: ICD-10-CM

## 2017-12-02 DIAGNOSIS — Z87891 Personal history of nicotine dependence: ICD-10-CM

## 2017-12-02 DIAGNOSIS — C61 Malignant neoplasm of prostate: ICD-10-CM

## 2017-12-02 DIAGNOSIS — I2510 Atherosclerotic heart disease of native coronary artery without angina pectoris: ICD-10-CM

## 2017-12-02 DIAGNOSIS — Z974 Presence of external hearing-aid: ICD-10-CM

## 2017-12-02 DIAGNOSIS — I10 Essential (primary) hypertension: ICD-10-CM

## 2017-12-02 DIAGNOSIS — E785 Hyperlipidemia, unspecified: ICD-10-CM

## 2017-12-02 DIAGNOSIS — E039 Hypothyroidism, unspecified: Principal | ICD-10-CM

## 2017-12-02 LAB — LIPID PROFILE
Lab: 112 mg/dL (ref <200)
Lab: 14 mg/dL
Lab: 44 mg/dL (ref >40)
Lab: 53 mg/dL (ref <100)
Lab: 68 mg/dL
Lab: 68 mg/dL (ref <150)

## 2017-12-02 MED ORDER — DOCUSATE SODIUM 100 MG PO CAP
100 mg | Freq: Every day | ORAL | 0 refills | Status: DC | PRN
Start: 2017-12-02 — End: 2017-12-02

## 2017-12-02 MED ORDER — ONDANSETRON HCL (PF) 4 MG/2 ML IJ SOLN
4 mg | INTRAVENOUS | 0 refills | Status: DC | PRN
Start: 2017-12-02 — End: 2017-12-02

## 2017-12-02 MED ORDER — SODIUM CHLORIDE 0.9 % IV SOLP
250 mL | INTRAVENOUS | 0 refills | Status: DC | PRN
Start: 2017-12-02 — End: 2017-12-02

## 2017-12-02 MED ORDER — LIDOCAINE (PF) 10 MG/ML (1 %) IJ SOLN
.1-2 mL | Freq: Once | INTRAMUSCULAR | 0 refills | Status: DC
Start: 2017-12-02 — End: 2017-12-02

## 2017-12-02 MED ORDER — ALUMINUM-MAGNESIUM HYDROXIDE 200-200 MG/5 ML PO SUSP
30 mL | ORAL | 0 refills | Status: DC | PRN
Start: 2017-12-02 — End: 2017-12-02

## 2017-12-02 MED ORDER — SODIUM CHLORIDE 0.9 % IV SOLP
INTRAVENOUS | 0 refills | Status: DC
Start: 2017-12-02 — End: 2017-12-02

## 2017-12-02 MED ORDER — DIPHENHYDRAMINE HCL 25 MG PO CAP
25 mg | ORAL | 0 refills | Status: DC | PRN
Start: 2017-12-02 — End: 2017-12-02

## 2017-12-02 MED ORDER — DIPHENHYDRAMINE HCL 50 MG/ML IJ SOLN
25 mg | INTRAVENOUS | 0 refills | Status: DC | PRN
Start: 2017-12-02 — End: 2017-12-02

## 2017-12-02 MED ORDER — ASPIRIN 325 MG PO TAB
325 mg | Freq: Once | ORAL | 0 refills | Status: CP
Start: 2017-12-02 — End: ?
  Administered 2017-12-02: 12:00:00 325 mg via ORAL

## 2017-12-02 MED ORDER — ACETAMINOPHEN 325 MG PO TAB
650 mg | ORAL | 0 refills | Status: DC | PRN
Start: 2017-12-02 — End: 2017-12-02

## 2017-12-02 NOTE — Progress Notes
Patient arrived on unit via ambulation accompanied by staff. Patient transferred to the bed without assistance.  Assessment completed, refer to flowsheet for details. Orders released, reviewed, and implemented as appropriate. Oriented to surroundings, call light within reach. Plan of care reviewed.  Frailty Score: 6  Will continue to monitor and assess.--Minna Merritts, RN

## 2017-12-02 NOTE — Discharge Instructions - Pharmacy
Physician Discharge Summary      Name: Aaron Fuentes  Medical Record Number: [id number]        Account Number:  [id number]  Date Of Birth:  02-Aug-1935                         Age:  82 years   Admit date:  12/02/2017                     Discharge date:  12/02/2017    Attending Physician:  Harley Alto, MD               Service: Cardiology-Interventional    Physician Summary completed by: Suzy Bouchard, PA-C    Reason for hospitalization: claudication, peripheral vascular disease, abdominal aortogram with runoff      Admission Lab/Radiology studies notable for:   Hematology:    Lab Results   Component Value Date    HGB 15.3 11/24/2017    HCT 46.6 11/24/2017    PLTCT 214 11/24/2017    WBC 7.7 11/24/2017    MCV 95.6 11/24/2017    MCHC 32.8 11/24/2017    RDW 13.6 11/24/2017   , General Chemistry:    Lab Results   Component Value Date    NA 136 11/24/2017    K 4.7 11/24/2017    CL 103 11/24/2017    GAP 14 11/24/2017    BUN 15.0 11/24/2017    CR 0.85 11/24/2017    GLU 99 11/24/2017    CA 10.0 11/24/2017    ALBUMIN 3.3 06/25/2017    LACTIC 1.3 06/25/2017    TOTBILI 0.90 06/25/2017    and Lipid Profile:   Lab Results   Component Value Date    CHOL 112 12/02/2017    TRIG 68 12/02/2017    HDL 44 12/02/2017    LDL 53 12/02/2017    VLDL 14 12/02/2017     Brief Hospital Course:    Aaron Fuentes???is a 83 y.o.???male with a history of hypertension, hypothyroidism, prostate cancer s/p prostatectomy, mild CAD and PVD. He has been noting claudication with c/o discomfort in both calves and his right hip when he walks. 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 Yetta Flock in [place]. ???At that time Pletal provided some relief and conservative management was pursued, but more recently, he has developed progressive symptoms and is now has to stop walking after 1-2 blocks because of discomfort. He had ABIs which were abnormal at 0.6 at rest and 0 with exercise on the right. On the left, ABI was 0.74 at rest, 0.63 with exercise. There was evidence of severe stenosis in the right???femoral artery with monophasic waveforms even above that, suggesting there might also be an iliac artery stenosis. The left SFA was again noted to be occluded.     The patient presented for abdominal aortogram with runoff, which was done via left radial access and revealed very tortuous, heavily calcified disease involving the abdominal aorta and the common and external iliac arteries bilaterally. There was evidence of severe stenosis of the common and superficial femoral arteries bilaterally, which Dr. Chales Abrahams did not feel could be treated adequately with percutaneous intervention. An outpatient consultation with vascular surgery was recommended, if the patient feels his claudication is affecting his quality of life. He was instructed regarding a heart healthy diet and exercise and will continue GDMT. His LDL was 53  and he will continue atorvastatin 80mg  daily and zetia. He will follow up with Dr. Chales Abrahams in 6 months.    Condition at Discharge: Stable   BP 107/61  - Pulse 97  - Temp 36.8 ???C (98.2 ???F)  - Ht 1.727 m (5' 8)  - Wt 86.4 kg (190 lb 7.6 oz)  - SpO2 (!) 90%  - BMI 28.96 kg/m???   Left radial and ulnar pulses intact with good capillary refill. Left wrist without swelling/hematoma.      Discharge Diagnoses:    Hospital Problems        Active Problems    * (Principal) PVD (peripheral vascular disease) with claudication (HCC)    HTN (hypertension)    Hyperlipidemia    Extremity atherosclerosis with intermittent claudication (HCC)        Significant Diagnostic Studies and Procedures:   AARO 12/03/2017  RIGHT LOWER EXTREMITY:  1. The right common iliac appeared to be very tortuous with mild atherosclerotic plaques noted with mild, heavily calcified atherosclerotic plaque noted.  It bifurcates into right internal and external iliac arteries. 2. Mild disease was noted in the right internal iliac artery.  3. The right external iliac artery appeared to have heavy calcification.  It is also very tortuous and continues as a common femoral artery.  4. The right common femoral artery appeared to have a high-grade 90% stenosis close to the bifurcation that also appeared to be heavily calcified.  It bifurcates into profunda and SFA.  5. The profunda artery appeared to have only mild disease.  It fills the SFA retrogradely.  6. The right SFA appeared to have diffuse, heavily calcified, multiple stenotic lesions across its proximal and mid course with reconstitution of the vessel distally, probably from collaterals from the profunda.  It continued as popliteal artery below the knee which has also mild distal disease, 20% to 30%, that is also calcified.  7. The popliteal artery bifurcates into anterior tibial and TP trunk.  The anterior tibial has mild-to-moderate diffuse disease in the proximal and the mid part, and appeared to be probably occluded at the ankle level.  8. The TP trunk appeared to have heavy calcification with severe stenosis noted proximally.  The peroneal appeared to be occluded.  The posterior tibial continued to the foot with 2-vessel runoff to the foot through both the PT and the anterior tibial.   LEFT LOWER EXTREMITY:  1. The left common iliac artery appeared to be heavily calcified.  It bifurcates and is tortuous.  It bifurcates into left internal and external iliac artery.  2. The left internal iliac artery appeared to have a mild disease ostially.  3. The left external iliac artery appeared to be heavily calcified throughout.  It is a continuous common femoral artery below the inguinal ligament.  4. The left common femoral artery appeared to have heavy calcification with possibly about 70% stenosis.  It bifurcates into left profunda and the SFA.  5. The left SFA appeared to have heavy calcification in the proximal mid segment with a chronic total occlusion and reconstitution of the vessel distally.  6. The left profunda appeared to be with no significant disease.  It gave rise to collaterals that fill the SFA distally.  The distal SFA continued as popliteal artery below the knee.  The popliteal artery has a 50% stenosis.  It gives rise to anterior tibial and TP trunk.  The anterior tibial appeared to have 100% occlusion of the proximal vessel.  7. The TP  trunk gives rise to a peroneal and posterior tibial, both run to the left foot as 2-vessel runoff.      Patient instructions/medications:      AMB REFERRAL TO VASCULAR SURGERY   Referral Priority: Routine Referral Type: Consult, Test & Treat   Referral Reason: Specialty Services Required   Number of Visits Requested: 1 Expiration Date: 12/02/18     Procedure Specific Activity    *May return to work/school in 2 days.  *May shower after discharge.  *NO lifting, pushing or pulling more than 5 pounds for one week to affected hand. You may use your wrist splint for a reminder!  *NO strenuous activity for 1 week.  *NO driving for 2 days.  *NO baths or swimming for 1 week.  *NO sexual activity for 1 week.     Report These Signs and Symptoms    Please contact your doctor if you have any of the following symptoms: Chest pain, shortness of breath, lightheadedness, dizziness, near fainting, palpitations, or bleeding.     Questions About Your Stay    For questions or concerns regarding your hospital stay:    - DURING BUSINESS HOURS (8:00 AM - 4:30 PM):    Call [phone] and asked to be transferred to your discharge attending physician.    - AFTER BUSINESS HOURS (4:30 PM - 8:00 AM, on weekends, or holidays):  Call [phone] and ask the operator to page the on-call doctor for the discharge attending physician.     Discharging attending physician: Harley Alto [[id number]]      Cardiac Diet    Limiting unhealthy fats and cholesterol is the most important step you can take in reducing your risk for cardiovascular disease.  Unhealthy fats include saturated and trans fats.  Monitor your sodium and cholesterol intake.  Restrict your sodium to 2g (grams) or 2000mg  (milligrams) daily, and your cholesterol to 200mg  daily.    If you have questions regarding your diet at home, you may contact a dietitian at [phone].       Incision Care    *Call if there is an increase in pain, swelling, or redness.  *DO NOT soak incision in water.  *NO tub baths, hot tubs, or swimming.  *You may shower after discharge.     Return Appointment    Please call Cardiovascular Medicine ( [place] ) Central Scheduling at [phone], Monday through Friday, between 8 a.m. to 5 p.m. to make your follow up hospitalization appointment for 6 months.    [facility] - [address]  [address], [address]  [place], [place] [address]  [phone]     [place] Provider Harley Alto [[id number]]      [facility] Information    [facility] is a gathering place for individuals, families, and friends living with serious or chronic physical illness.  They offer education and support programs that help you live your life to the fullest.  Unless otherwise noted, programs are offered at NO CHARGE.  However, REGISTRATION IS REQUIRED 48 hours in advance.    To arrange for a tour, register for a class, or ask a questions please call [phone].  You can also visit [email or web address] for more information.     Request for Cardiology Appointment   Standing Status: Future Standing Exp. Date: 12/02/22     Scheduling Priority: Routine 6 months   Schedule OV with (1st choice Provider) Harley Alto M.D.    Location of Appointment [facility] / Mon-Fri  Current Discharge Medication List       CONTINUE these medications which have NOT CHANGED    Details   atorvastatin (LIPITOR) 80 mg tablet TAKE 1 TABLET EVERY DAY  Qty: 90 tablet, Refills: 3    PRESCRIPTION TYPE:  Normal cilostazol(+) (PLETAL) 100 mg tablet Take 100 mg by mouth twice daily.      PRESCRIPTION TYPE:  Historical Med  Associated Diagnoses: CAD (coronary artery disease); HTN (hypertension); Hyperlipidemia      cyanocobalamin (VITAMIN B-12) 500 mcg tablet Take 1,000 mcg by mouth daily.    PRESCRIPTION TYPE:  Historical Med      diclofenac sodium (PENNSAID) 2 % sopk Apply  topically to affected area as Needed.    PRESCRIPTION TYPE:  Historical Med      donepezil (ARICEPT) 5 mg tablet Take one tablet by mouth at bedtime daily.  Qty: 90 tablet, Refills: 5    PRESCRIPTION TYPE:  Normal  Associated Diagnoses: Cognitive impairment      ezetimibe (ZETIA) 10 mg tablet Take one tablet by mouth daily.  Qty: 90 tablet, Refills: 3    PRESCRIPTION TYPE:  Normal      levoFLOXacin (LEVAQUIN) 500 mg tablet Take 500 mg by mouth daily.    PRESCRIPTION TYPE:  Historical Med      Levothyroxine 125 mcg Cap Take 1 Tab by mouth Daily.    PRESCRIPTION TYPE:  Historical Med      meloxicam (MOBIC) 15 mg tablet Take 15 mg by mouth daily.    PRESCRIPTION TYPE:  Historical Med      Ramipril (ALTACE) 10 mg Tab Take 1 Tab by mouth Daily.    PRESCRIPTION TYPE:  Historical Med              Scheduled appointments:    May 26, 2018  3:40 PM CDT  (Arrive by 3:25 PM)  Return Patient with Harsh Delia Chimes, MD  [facility]) [facility] [address]  [address]  [place] [address]  [phone]        Signed:  Suzy Bouchard, Cordelia Poche  12/03/2017      cc:  Primary Care Physician:  Steva Ready   Verified  Referring physicians:  Steva Ready, MD

## 2017-12-02 NOTE — Progress Notes
Keith discharged on 12/02/2017  .  Patient discharged to home with all belongings.  Discharge instructions, med reconciliation and home wound care instructions given and explained to patient and family both verbally and written.  Accompanied by staff.  No complaints of pain or discomfort.   Left wrist site remains clean, dry, and intact with no evidence of a hematoma or bleeding after ambulation.  Patient escorted to lobby via ambulation with RN Patient to follow up with [facility]) or on-call physician with any additional questions or concerns.  All contact numbers provided.  Patient and family acceptant of DC instuctions and report understanding to all information.    IV catheter removed, tip intact    Minna Merritts, RN

## 2017-12-04 ENCOUNTER — Encounter: Admit: 2017-12-04 | Discharge: 2017-12-04 | Payer: MEDICARE

## 2017-12-04 DIAGNOSIS — R69 Illness, unspecified: Principal | ICD-10-CM

## 2017-12-14 ENCOUNTER — Encounter: Admit: 2017-12-14 | Discharge: 2017-12-14 | Payer: MEDICARE

## 2017-12-14 ENCOUNTER — Ambulatory Visit: Admit: 2017-12-14 | Discharge: 2017-12-14 | Payer: MEDICARE

## 2017-12-14 ENCOUNTER — Ambulatory Visit: Admit: 2017-12-14 | Discharge: 2017-12-15 | Payer: MEDICARE

## 2017-12-14 DIAGNOSIS — I739 Peripheral vascular disease, unspecified: ICD-10-CM

## 2017-12-14 DIAGNOSIS — I10 Essential (primary) hypertension: ICD-10-CM

## 2017-12-14 DIAGNOSIS — E039 Hypothyroidism, unspecified: Principal | ICD-10-CM

## 2017-12-14 DIAGNOSIS — Z01818 Encounter for other preprocedural examination: ICD-10-CM

## 2017-12-14 DIAGNOSIS — J449 Chronic obstructive pulmonary disease, unspecified: ICD-10-CM

## 2017-12-14 DIAGNOSIS — C61 Malignant neoplasm of prostate: ICD-10-CM

## 2017-12-14 DIAGNOSIS — Z974 Presence of external hearing-aid: ICD-10-CM

## 2017-12-14 DIAGNOSIS — Z8249 Family history of ischemic heart disease and other diseases of the circulatory system: ICD-10-CM

## 2017-12-14 DIAGNOSIS — E782 Mixed hyperlipidemia: ICD-10-CM

## 2017-12-14 DIAGNOSIS — I2510 Atherosclerotic heart disease of native coronary artery without angina pectoris: ICD-10-CM

## 2017-12-14 DIAGNOSIS — I70223 Atherosclerosis of native arteries of extremities with rest pain, bilateral legs: Principal | ICD-10-CM

## 2017-12-14 DIAGNOSIS — E785 Hyperlipidemia, unspecified: ICD-10-CM

## 2017-12-14 DIAGNOSIS — M1990 Unspecified osteoarthritis, unspecified site: ICD-10-CM

## 2017-12-14 DIAGNOSIS — I70219 Atherosclerosis of native arteries of extremities with intermittent claudication, unspecified extremity: Principal | ICD-10-CM

## 2017-12-14 MED ORDER — CEFAZOLIN INJ 1GM IVP
2 g | Freq: Once | INTRAVENOUS | 0 refills | Status: CN
Start: 2017-12-14 — End: ?

## 2017-12-15 DIAGNOSIS — N320 Bladder-neck obstruction: Secondary | ICD-10-CM

## 2017-12-15 DIAGNOSIS — I739 Peripheral vascular disease, unspecified: ICD-10-CM

## 2017-12-16 ENCOUNTER — Inpatient Hospital Stay: Admit: 2017-12-16 | Discharge: 2017-12-16 | Payer: MEDICARE

## 2017-12-16 DIAGNOSIS — M79662 Pain in left lower leg: Secondary | ICD-10-CM

## 2017-12-17 ENCOUNTER — Encounter: Admit: 2017-12-17 | Discharge: 2017-12-17 | Payer: MEDICARE

## 2017-12-17 ENCOUNTER — Inpatient Hospital Stay: Admit: 2017-12-17 | Discharge: 2017-12-17 | Payer: MEDICARE

## 2017-12-17 LAB — COMPREHENSIVE METABOLIC PANEL
Lab: 0.7 mg/dL (ref 0.3–1.2)
Lab: 0.8 mg/dL (ref 0.4–1.24)
Lab: 10 mg/dL (ref 8.5–10.6)
Lab: 110 mg/dL — ABNORMAL HIGH (ref 70–100)
Lab: 4 % — ABNORMAL LOW (ref 3–12)
Lab: 4.2 g/dL (ref 3.5–5.0)
Lab: 4.3 MMOL/L (ref 3.5–5.1)
Lab: 6.9 g/dL (ref 6.0–8.0)

## 2017-12-17 LAB — CBC
Lab: 31 pg (ref 26–34)
Lab: 6.4 10*3/uL (ref 4.5–11.0)

## 2017-12-17 MED ORDER — OXYCODONE 5 MG PO TAB
5-10 mg | Freq: Once | ORAL | 0 refills | Status: DC | PRN
Start: 2017-12-17 — End: 2017-12-17

## 2017-12-17 MED ORDER — DIPHENHYDRAMINE HCL 50 MG/ML IJ SOLN
25 mg | Freq: Once | INTRAVENOUS | 0 refills | Status: DC | PRN
Start: 2017-12-17 — End: 2017-12-17

## 2017-12-17 MED ORDER — CEFAZOLIN INJ 1GM IVP
2 g | Freq: Once | INTRAVENOUS | 0 refills | Status: CP
Start: 2017-12-17 — End: ?
  Administered 2017-12-17: 17:00:00 2 g via INTRAVENOUS

## 2017-12-17 MED ORDER — EPHEDRINE SULFATE 50 MG/5ML SYR (10 MG/ML) (AN)(OSM)
0 refills | Status: DC
Start: 2017-12-17 — End: 2017-12-17
  Administered 2017-12-17 (×2): 5 mg via INTRAVENOUS

## 2017-12-17 MED ORDER — ACETAMINOPHEN 500 MG PO TAB
1000 mg | ORAL | 0 refills | Status: DC
Start: 2017-12-17 — End: 2017-12-19
  Administered 2017-12-17 – 2017-12-19 (×5): 1000 mg via ORAL

## 2017-12-17 MED ORDER — ATORVASTATIN 40 MG PO TAB
80 mg | Freq: Every day | ORAL | 0 refills | Status: DC
Start: 2017-12-17 — End: 2017-12-19
  Administered 2017-12-18: 15:00:00 80 mg via ORAL

## 2017-12-17 MED ORDER — RAMIPRIL 10 MG PO CAP
10 mg | Freq: Every day | ORAL | 0 refills | Status: DC
Start: 2017-12-17 — End: 2017-12-19
  Administered 2017-12-18 – 2017-12-19 (×2): 10 mg via ORAL

## 2017-12-17 MED ORDER — HEPARIN 5,000 UNITS IN NS 500 ML IRR SOLN (OR)
0 refills | Status: DC
Start: 2017-12-17 — End: 2017-12-17
  Administered 2017-12-17 (×2): 500 mL

## 2017-12-17 MED ORDER — FENTANYL CITRATE (PF) 50 MCG/ML IJ SOLN
0 refills | Status: DC
Start: 2017-12-17 — End: 2017-12-17
  Administered 2017-12-17: 17:00:00 50 ug via INTRAVENOUS
  Administered 2017-12-17: 16:00:00 100 ug via INTRAVENOUS
  Administered 2017-12-17 (×2): 25 ug via INTRAVENOUS

## 2017-12-17 MED ORDER — ROCURONIUM 10 MG/ML IV SOLN
INTRAVENOUS | 0 refills | Status: DC
Start: 2017-12-17 — End: 2017-12-17
  Administered 2017-12-17 (×5): 10 mg via INTRAVENOUS
  Administered 2017-12-17: 16:00:00 50 mg via INTRAVENOUS
  Administered 2017-12-17: 17:00:00 10 mg via INTRAVENOUS

## 2017-12-17 MED ORDER — DEXAMETHASONE SODIUM PHOSPHATE 4 MG/ML IJ SOLN
INTRAVENOUS | 0 refills | Status: DC
Start: 2017-12-17 — End: 2017-12-17
  Administered 2017-12-17: 16:00:00 4 mg via INTRAVENOUS

## 2017-12-17 MED ORDER — CEFAZOLIN 1GM NS IRR
0 refills | Status: DC
Start: 2017-12-17 — End: 2017-12-17
  Administered 2017-12-17 (×2): 1000 mL

## 2017-12-17 MED ORDER — PHENYLEPHRINE IN 0.9% NACL(PF) 1 MG/10 ML (100 MCG/ML) IV SYRG
INTRAVENOUS | 0 refills | Status: DC
Start: 2017-12-17 — End: 2017-12-17
  Administered 2017-12-17: 16:00:00 200 ug via INTRAVENOUS
  Administered 2017-12-17: 16:00:00 100 ug via INTRAVENOUS
  Administered 2017-12-17: 16:00:00 250 ug via INTRAVENOUS
  Administered 2017-12-17: 18:00:00 150 ug via INTRAVENOUS

## 2017-12-17 MED ORDER — ELECTROLYTE-A IV SOLP
0 refills | Status: DC
Start: 2017-12-17 — End: 2017-12-17
  Administered 2017-12-17: 16:00:00 via INTRAVENOUS

## 2017-12-17 MED ORDER — DEXTRAN 70-HYPROMELLOSE (PF) 0.1-0.3 % OP DPET
0 refills | Status: DC
Start: 2017-12-17 — End: 2017-12-17
  Administered 2017-12-17: 16:00:00 2 [drp] via OPHTHALMIC

## 2017-12-17 MED ORDER — PROTAMINE 10 MG/ML IV SOLN
0 refills | Status: DC
Start: 2017-12-17 — End: 2017-12-17
  Administered 2017-12-17: 20:00:00 30 mg via INTRAVENOUS

## 2017-12-17 MED ORDER — LIDOCAINE (PF) 200 MG/10 ML (2 %) IJ SYRG
0 refills | Status: DC
Start: 2017-12-17 — End: 2017-12-17
  Administered 2017-12-17: 16:00:00 80 mg via INTRAVENOUS

## 2017-12-17 MED ORDER — ESMOLOL 100 MG/10 ML (10 MG/ML) IV SOLN
0 refills | Status: DC
Start: 2017-12-17 — End: 2017-12-17
  Administered 2017-12-17 (×2): 50 mg via INTRAVENOUS

## 2017-12-17 MED ORDER — ONDANSETRON HCL (PF) 4 MG/2 ML IJ SOLN
INTRAVENOUS | 0 refills | Status: DC
Start: 2017-12-17 — End: 2017-12-17
  Administered 2017-12-17: 20:00:00 4 mg via INTRAVENOUS

## 2017-12-17 MED ORDER — MEPERIDINE (PF) 25 MG/ML IJ SYRG
12.5 mg | INTRAVENOUS | 0 refills | Status: DC | PRN
Start: 2017-12-17 — End: 2017-12-17

## 2017-12-17 MED ORDER — FENTANYL CITRATE (PF) 50 MCG/ML IJ SOLN
50 ug | INTRAVENOUS | 0 refills | Status: DC | PRN
Start: 2017-12-17 — End: 2017-12-17

## 2017-12-17 MED ORDER — SUGAMMADEX 100 MG/ML IV SOLN
INTRAVENOUS | 0 refills | Status: DC
Start: 2017-12-17 — End: 2017-12-17
  Administered 2017-12-17: 20:00:00 170 mg via INTRAVENOUS

## 2017-12-17 MED ORDER — PHENYLEPHRINE IV DRIP (STD CONC)
0 refills | Status: DC
Start: 2017-12-17 — End: 2017-12-17
  Administered 2017-12-17 (×2): 1 ug/kg/min via INTRAVENOUS

## 2017-12-17 MED ORDER — LACTATED RINGERS IV SOLP
1000 mL | INTRAVENOUS | 0 refills | Status: DC
Start: 2017-12-17 — End: 2017-12-17

## 2017-12-17 MED ORDER — LIDOCAINE (PF) 10 MG/ML (1 %) IJ SOLN
.1-2 mL | INTRAMUSCULAR | 0 refills | Status: DC | PRN
Start: 2017-12-17 — End: 2017-12-17

## 2017-12-17 MED ORDER — HEPARIN (PORCINE) 1,000 UNIT/ML IJ SOLN
0 refills | Status: DC
Start: 2017-12-17 — End: 2017-12-17
  Administered 2017-12-17: 18:00:00 8000 [IU] via INTRAVENOUS
  Administered 2017-12-17: 19:00:00 2000 [IU] via INTRAVENOUS

## 2017-12-17 MED ORDER — HALOPERIDOL LACTATE 5 MG/ML IJ SOLN
1 mg | Freq: Once | INTRAVENOUS | 0 refills | Status: DC | PRN
Start: 2017-12-17 — End: 2017-12-17

## 2017-12-17 MED ORDER — SODIUM CHLORIDE 0.9 % IV SOLP
INTRAVENOUS | 0 refills | Status: DC
Start: 2017-12-17 — End: 2017-12-18
  Administered 2017-12-17: 21:00:00 1000.000 mL via INTRAVENOUS
  Administered 2017-12-18: 09:00:00 80 mL via INTRAVENOUS
  Administered 2017-12-18: 23:00:00 1000.000 mL via INTRAVENOUS

## 2017-12-17 MED ORDER — HEPARIN 5,000 UNITS IN NS 500 ML IRR SOLN (OR)
Freq: Once | 0 refills | Status: DC
Start: 2017-12-17 — End: 2017-12-17

## 2017-12-17 MED ORDER — TRAMADOL 50 MG PO TAB
50 mg | ORAL | 0 refills | Status: DC | PRN
Start: 2017-12-17 — End: 2017-12-19
  Administered 2017-12-18: 20:00:00 50 mg via ORAL

## 2017-12-17 MED ORDER — DONEPEZIL 10 MG PO TAB
5 mg | Freq: Every evening | ORAL | 0 refills | Status: DC
Start: 2017-12-17 — End: 2017-12-19
  Administered 2017-12-18 – 2017-12-19 (×2): 5 mg via ORAL

## 2017-12-17 MED ORDER — ENOXAPARIN 40 MG/0.4 ML SC SYRG
40 mg | Freq: Every day | SUBCUTANEOUS | 0 refills | Status: DC
Start: 2017-12-17 — End: 2017-12-19
  Administered 2017-12-19: 02:00:00 40 mg via SUBCUTANEOUS

## 2017-12-17 MED ORDER — FENTANYL CITRATE (PF) 50 MCG/ML IJ SOLN
25 ug | INTRAVENOUS | 0 refills | Status: DC | PRN
Start: 2017-12-17 — End: 2017-12-17

## 2017-12-17 MED ORDER — PROMETHAZINE 25 MG/ML IJ SOLN
6.25 mg | INTRAVENOUS | 0 refills | Status: DC | PRN
Start: 2017-12-17 — End: 2017-12-17

## 2017-12-17 MED ORDER — PROPOFOL INJ 10 MG/ML IV VIAL
0 refills | Status: DC
Start: 2017-12-17 — End: 2017-12-17
  Administered 2017-12-17: 16:00:00 20 mg via INTRAVENOUS
  Administered 2017-12-17: 16:00:00 80 mg via INTRAVENOUS
  Administered 2017-12-17: 18:00:00 20 mg via INTRAVENOUS

## 2017-12-17 MED ORDER — LEVOTHYROXINE 125 MCG PO TAB
125 ug | Freq: Every day | ORAL | 0 refills | Status: DC
Start: 2017-12-17 — End: 2017-12-19
  Administered 2017-12-17 – 2017-12-19 (×3): 125 ug via ORAL

## 2017-12-17 MED ORDER — HYDRALAZINE 20 MG/ML IJ SOLN
10 mg | INTRAVENOUS | 0 refills | Status: DC | PRN
Start: 2017-12-17 — End: 2017-12-19

## 2017-12-17 MED ORDER — HYDROMORPHONE (PF) 2 MG/ML IJ SYRG
1 mg | INTRAVENOUS | 0 refills | Status: DC | PRN
Start: 2017-12-17 — End: 2017-12-17

## 2017-12-17 MED ADMIN — LACTATED RINGERS IV SOLP [4318]: 1000 mL | INTRAVENOUS | @ 14:00:00 | Stop: 2017-12-17 | NDC 00338011704

## 2017-12-18 LAB — BASIC METABOLIC PANEL: Lab: 108 MMOL/L — ABNORMAL HIGH (ref >60)

## 2017-12-18 LAB — CBC: Lab: 3.8 M/UL — ABNORMAL LOW (ref 4.4–5.5)

## 2017-12-18 MED ORDER — HYOSCYAMINE SULFATE 0.125 MG PO TBDI
.125 mg | SUBLINGUAL | 0 refills | Status: DC | PRN
Start: 2017-12-18 — End: 2017-12-19

## 2017-12-19 ENCOUNTER — Encounter: Admit: 2017-12-19 | Discharge: 2017-12-19 | Payer: MEDICARE

## 2017-12-19 ENCOUNTER — Inpatient Hospital Stay: Admit: 2017-12-17 | Discharge: 2017-12-19 | Disposition: A | Discharge status: Home / Self Care | Payer: MEDICARE

## 2017-12-19 DIAGNOSIS — Z9079 Acquired absence of other genital organ(s): ICD-10-CM

## 2017-12-19 DIAGNOSIS — Z96652 Presence of left artificial knee joint: ICD-10-CM

## 2017-12-19 DIAGNOSIS — E785 Hyperlipidemia, unspecified: ICD-10-CM

## 2017-12-19 DIAGNOSIS — Z87891 Personal history of nicotine dependence: ICD-10-CM

## 2017-12-19 DIAGNOSIS — Z974 Presence of external hearing-aid: ICD-10-CM

## 2017-12-19 DIAGNOSIS — Z9049 Acquired absence of other specified parts of digestive tract: ICD-10-CM

## 2017-12-19 DIAGNOSIS — E039 Hypothyroidism, unspecified: ICD-10-CM

## 2017-12-19 DIAGNOSIS — M1990 Unspecified osteoarthritis, unspecified site: ICD-10-CM

## 2017-12-19 DIAGNOSIS — J449 Chronic obstructive pulmonary disease, unspecified: ICD-10-CM

## 2017-12-19 DIAGNOSIS — I2510 Atherosclerotic heart disease of native coronary artery without angina pectoris: ICD-10-CM

## 2017-12-19 DIAGNOSIS — M79661 Pain in right lower leg: ICD-10-CM

## 2017-12-19 DIAGNOSIS — M79662 Pain in left lower leg: ICD-10-CM

## 2017-12-19 DIAGNOSIS — Z8546 Personal history of malignant neoplasm of prostate: ICD-10-CM

## 2017-12-19 DIAGNOSIS — I70212 Atherosclerosis of native arteries of extremities with intermittent claudication, left leg: Principal | ICD-10-CM

## 2017-12-19 DIAGNOSIS — I714 Abdominal aortic aneurysm, without rupture: ICD-10-CM

## 2017-12-19 DIAGNOSIS — I10 Essential (primary) hypertension: ICD-10-CM

## 2017-12-19 DIAGNOSIS — N3289 Other specified disorders of bladder: ICD-10-CM

## 2017-12-19 DIAGNOSIS — N320 Bladder-neck obstruction: ICD-10-CM

## 2017-12-19 LAB — PHOSPHORUS: Lab: 3.1 mg/dL — ABNORMAL HIGH (ref >60)

## 2017-12-19 LAB — MAGNESIUM: Lab: 1.8 mg/dL (ref 1.6–2.6)

## 2017-12-19 LAB — BASIC METABOLIC PANEL: Lab: 136 MMOL/L — ABNORMAL LOW (ref 137–147)

## 2017-12-19 LAB — CBC: Lab: 7.8 10*3/uL — ABNORMAL LOW (ref >60)

## 2017-12-19 MED ORDER — TRAMADOL 50 MG PO TAB
50 mg | ORAL_TABLET | ORAL | 0 refills | Status: AC | PRN
Start: 2017-12-19 — End: 2018-01-04
  Filled 2017-12-19 (×2): qty 10, 3d supply, fill #1

## 2017-12-19 MED ORDER — HYOSCYAMINE SULFATE 0.125 MG PO TBDI
.125 mg | ORAL_TABLET | SUBLINGUAL | 0 refills | Status: AC | PRN
Start: 2017-12-19 — End: 2018-01-04
  Filled 2017-12-19 (×2): qty 30, 5d supply, fill #1

## 2017-12-21 ENCOUNTER — Encounter: Admit: 2017-12-21 | Discharge: 2017-12-21 | Payer: MEDICARE

## 2017-12-21 DIAGNOSIS — I10 Essential (primary) hypertension: ICD-10-CM

## 2017-12-21 DIAGNOSIS — Z8249 Family history of ischemic heart disease and other diseases of the circulatory system: ICD-10-CM

## 2017-12-21 DIAGNOSIS — J449 Chronic obstructive pulmonary disease, unspecified: ICD-10-CM

## 2017-12-21 DIAGNOSIS — C61 Malignant neoplasm of prostate: ICD-10-CM

## 2017-12-21 DIAGNOSIS — I739 Peripheral vascular disease, unspecified: ICD-10-CM

## 2017-12-21 DIAGNOSIS — M1990 Unspecified osteoarthritis, unspecified site: ICD-10-CM

## 2017-12-21 DIAGNOSIS — E039 Hypothyroidism, unspecified: Principal | ICD-10-CM

## 2017-12-21 DIAGNOSIS — E785 Hyperlipidemia, unspecified: ICD-10-CM

## 2017-12-21 DIAGNOSIS — I2510 Atherosclerotic heart disease of native coronary artery without angina pectoris: ICD-10-CM

## 2017-12-21 DIAGNOSIS — Z974 Presence of external hearing-aid: ICD-10-CM

## 2018-01-04 ENCOUNTER — Encounter: Admit: 2018-01-04 | Discharge: 2018-01-04 | Payer: MEDICARE

## 2018-01-04 ENCOUNTER — Ambulatory Visit: Admit: 2018-01-04 | Discharge: 2018-01-05 | Payer: MEDICARE

## 2018-01-04 ENCOUNTER — Ambulatory Visit: Admit: 2018-01-04 | Discharge: 2018-01-04 | Payer: MEDICARE

## 2018-01-04 DIAGNOSIS — I739 Peripheral vascular disease, unspecified: ICD-10-CM

## 2018-01-04 DIAGNOSIS — M1990 Unspecified osteoarthritis, unspecified site: ICD-10-CM

## 2018-01-04 DIAGNOSIS — Z8249 Family history of ischemic heart disease and other diseases of the circulatory system: ICD-10-CM

## 2018-01-04 DIAGNOSIS — I10 Essential (primary) hypertension: ICD-10-CM

## 2018-01-04 DIAGNOSIS — J449 Chronic obstructive pulmonary disease, unspecified: ICD-10-CM

## 2018-01-04 DIAGNOSIS — Z95828 Presence of other vascular implants and grafts: ICD-10-CM

## 2018-01-04 DIAGNOSIS — C61 Malignant neoplasm of prostate: ICD-10-CM

## 2018-01-04 DIAGNOSIS — I2510 Atherosclerotic heart disease of native coronary artery without angina pectoris: ICD-10-CM

## 2018-01-04 DIAGNOSIS — Z974 Presence of external hearing-aid: ICD-10-CM

## 2018-01-04 DIAGNOSIS — E039 Hypothyroidism, unspecified: Principal | ICD-10-CM

## 2018-01-04 DIAGNOSIS — E785 Hyperlipidemia, unspecified: ICD-10-CM

## 2018-01-04 DIAGNOSIS — I70219 Atherosclerosis of native arteries of extremities with intermittent claudication, unspecified extremity: Principal | ICD-10-CM

## 2018-01-13 ENCOUNTER — Encounter: Admit: 2018-01-13 | Discharge: 2018-01-13 | Payer: MEDICARE

## 2018-01-13 DIAGNOSIS — R69 Illness, unspecified: Principal | ICD-10-CM

## 2018-02-18 ENCOUNTER — Encounter: Admit: 2018-02-18 | Discharge: 2018-02-18 | Payer: MEDICARE

## 2018-02-18 DIAGNOSIS — I2510 Atherosclerotic heart disease of native coronary artery without angina pectoris: ICD-10-CM

## 2018-02-18 DIAGNOSIS — I739 Peripheral vascular disease, unspecified: ICD-10-CM

## 2018-02-18 DIAGNOSIS — M1990 Unspecified osteoarthritis, unspecified site: ICD-10-CM

## 2018-02-18 DIAGNOSIS — J449 Chronic obstructive pulmonary disease, unspecified: ICD-10-CM

## 2018-02-18 DIAGNOSIS — C61 Malignant neoplasm of prostate: ICD-10-CM

## 2018-02-18 DIAGNOSIS — Z974 Presence of external hearing-aid: ICD-10-CM

## 2018-02-18 DIAGNOSIS — I10 Essential (primary) hypertension: ICD-10-CM

## 2018-02-18 DIAGNOSIS — Z8249 Family history of ischemic heart disease and other diseases of the circulatory system: ICD-10-CM

## 2018-02-18 DIAGNOSIS — E039 Hypothyroidism, unspecified: Principal | ICD-10-CM

## 2018-02-18 DIAGNOSIS — E785 Hyperlipidemia, unspecified: ICD-10-CM

## 2018-04-05 ENCOUNTER — Encounter: Admit: 2018-04-05 | Discharge: 2018-04-05 | Payer: MEDICARE

## 2018-04-09 ENCOUNTER — Ambulatory Visit: Admit: 2018-04-09 | Discharge: 2018-04-10 | Payer: MEDICARE

## 2018-04-09 ENCOUNTER — Encounter: Admit: 2018-04-09 | Discharge: 2018-04-09 | Payer: MEDICARE

## 2018-04-09 ENCOUNTER — Ambulatory Visit: Admit: 2018-04-09 | Discharge: 2018-04-09 | Payer: MEDICARE

## 2018-04-09 DIAGNOSIS — I2510 Atherosclerotic heart disease of native coronary artery without angina pectoris: ICD-10-CM

## 2018-04-09 DIAGNOSIS — Z8249 Family history of ischemic heart disease and other diseases of the circulatory system: ICD-10-CM

## 2018-04-09 DIAGNOSIS — E039 Hypothyroidism, unspecified: Principal | ICD-10-CM

## 2018-04-09 DIAGNOSIS — E785 Hyperlipidemia, unspecified: ICD-10-CM

## 2018-04-09 DIAGNOSIS — I10 Essential (primary) hypertension: ICD-10-CM

## 2018-04-09 DIAGNOSIS — Z974 Presence of external hearing-aid: ICD-10-CM

## 2018-04-09 DIAGNOSIS — I739 Peripheral vascular disease, unspecified: Principal | ICD-10-CM

## 2018-04-09 DIAGNOSIS — M1990 Unspecified osteoarthritis, unspecified site: ICD-10-CM

## 2018-04-09 DIAGNOSIS — L859 Epidermal thickening, unspecified: ICD-10-CM

## 2018-04-09 DIAGNOSIS — C61 Malignant neoplasm of prostate: ICD-10-CM

## 2018-04-09 DIAGNOSIS — Z9889 Other specified postprocedural states: ICD-10-CM

## 2018-04-09 DIAGNOSIS — K1321 Leukoplakia of oral mucosa, including tongue: ICD-10-CM

## 2018-04-09 DIAGNOSIS — J449 Chronic obstructive pulmonary disease, unspecified: ICD-10-CM

## 2018-04-14 ENCOUNTER — Encounter: Admit: 2018-04-14 | Discharge: 2018-04-14 | Payer: MEDICARE

## 2018-04-14 DIAGNOSIS — I739 Peripheral vascular disease, unspecified: Principal | ICD-10-CM

## 2018-05-26 ENCOUNTER — Ambulatory Visit: Admit: 2018-05-26 | Discharge: 2018-05-27 | Payer: MEDICARE

## 2018-05-26 ENCOUNTER — Encounter: Admit: 2018-05-26 | Discharge: 2018-05-26 | Payer: MEDICARE

## 2018-05-26 DIAGNOSIS — I2510 Atherosclerotic heart disease of native coronary artery without angina pectoris: ICD-10-CM

## 2018-05-26 DIAGNOSIS — I10 Essential (primary) hypertension: ICD-10-CM

## 2018-05-26 DIAGNOSIS — E039 Hypothyroidism, unspecified: Principal | ICD-10-CM

## 2018-05-26 DIAGNOSIS — M1990 Unspecified osteoarthritis, unspecified site: ICD-10-CM

## 2018-05-26 DIAGNOSIS — E785 Hyperlipidemia, unspecified: ICD-10-CM

## 2018-05-26 DIAGNOSIS — L859 Epidermal thickening, unspecified: ICD-10-CM

## 2018-05-26 DIAGNOSIS — C61 Malignant neoplasm of prostate: ICD-10-CM

## 2018-05-26 DIAGNOSIS — J449 Chronic obstructive pulmonary disease, unspecified: ICD-10-CM

## 2018-05-26 DIAGNOSIS — I739 Peripheral vascular disease, unspecified: ICD-10-CM

## 2018-05-26 DIAGNOSIS — Z8249 Family history of ischemic heart disease and other diseases of the circulatory system: ICD-10-CM

## 2018-05-26 DIAGNOSIS — Z974 Presence of external hearing-aid: ICD-10-CM

## 2018-05-26 DIAGNOSIS — K1321 Leukoplakia of oral mucosa, including tongue: ICD-10-CM

## 2018-05-27 DIAGNOSIS — R4189 Other symptoms and signs involving cognitive functions and awareness: Principal | ICD-10-CM

## 2018-07-02 ENCOUNTER — Ambulatory Visit: Admit: 2018-07-02 | Discharge: 2018-07-03 | Payer: MEDICARE

## 2018-07-02 ENCOUNTER — Ambulatory Visit: Admit: 2018-07-02 | Discharge: 2018-07-02 | Payer: MEDICARE

## 2018-07-02 ENCOUNTER — Encounter: Admit: 2018-07-02 | Discharge: 2018-07-02 | Payer: MEDICARE

## 2018-07-02 DIAGNOSIS — I70219 Atherosclerosis of native arteries of extremities with intermittent claudication, unspecified extremity: Principal | ICD-10-CM

## 2018-07-02 DIAGNOSIS — K1321 Leukoplakia of oral mucosa, including tongue: ICD-10-CM

## 2018-07-02 DIAGNOSIS — C61 Malignant neoplasm of prostate: ICD-10-CM

## 2018-07-02 DIAGNOSIS — Z9889 Other specified postprocedural states: ICD-10-CM

## 2018-07-02 DIAGNOSIS — Z8249 Family history of ischemic heart disease and other diseases of the circulatory system: ICD-10-CM

## 2018-07-02 DIAGNOSIS — M1990 Unspecified osteoarthritis, unspecified site: ICD-10-CM

## 2018-07-02 DIAGNOSIS — E785 Hyperlipidemia, unspecified: ICD-10-CM

## 2018-07-02 DIAGNOSIS — I10 Essential (primary) hypertension: ICD-10-CM

## 2018-07-02 DIAGNOSIS — I2510 Atherosclerotic heart disease of native coronary artery without angina pectoris: ICD-10-CM

## 2018-07-02 DIAGNOSIS — I739 Peripheral vascular disease, unspecified: ICD-10-CM

## 2018-07-02 DIAGNOSIS — Z974 Presence of external hearing-aid: ICD-10-CM

## 2018-07-02 DIAGNOSIS — L859 Epidermal thickening, unspecified: ICD-10-CM

## 2018-07-02 DIAGNOSIS — E039 Hypothyroidism, unspecified: Principal | ICD-10-CM

## 2018-07-02 DIAGNOSIS — J449 Chronic obstructive pulmonary disease, unspecified: ICD-10-CM

## 2018-07-07 ENCOUNTER — Encounter: Admit: 2018-07-07 | Discharge: 2018-07-07 | Payer: MEDICARE

## 2018-07-07 ENCOUNTER — Ambulatory Visit: Admit: 2018-07-07 | Discharge: 2018-07-08 | Payer: MEDICARE

## 2018-07-07 DIAGNOSIS — M1990 Unspecified osteoarthritis, unspecified site: ICD-10-CM

## 2018-07-07 DIAGNOSIS — J449 Chronic obstructive pulmonary disease, unspecified: ICD-10-CM

## 2018-07-07 DIAGNOSIS — L859 Epidermal thickening, unspecified: ICD-10-CM

## 2018-07-07 DIAGNOSIS — E039 Hypothyroidism, unspecified: Principal | ICD-10-CM

## 2018-07-07 DIAGNOSIS — I739 Peripheral vascular disease, unspecified: ICD-10-CM

## 2018-07-07 DIAGNOSIS — E785 Hyperlipidemia, unspecified: ICD-10-CM

## 2018-07-07 DIAGNOSIS — Z974 Presence of external hearing-aid: ICD-10-CM

## 2018-07-07 DIAGNOSIS — C61 Malignant neoplasm of prostate: ICD-10-CM

## 2018-07-07 DIAGNOSIS — Z8249 Family history of ischemic heart disease and other diseases of the circulatory system: ICD-10-CM

## 2018-07-07 DIAGNOSIS — I2510 Atherosclerotic heart disease of native coronary artery without angina pectoris: ICD-10-CM

## 2018-07-07 DIAGNOSIS — I10 Essential (primary) hypertension: ICD-10-CM

## 2018-07-07 DIAGNOSIS — K1321 Leukoplakia of oral mucosa, including tongue: ICD-10-CM

## 2018-07-08 DIAGNOSIS — R4189 Other symptoms and signs involving cognitive functions and awareness: Secondary | ICD-10-CM

## 2018-07-08 DIAGNOSIS — R413 Other amnesia: Principal | ICD-10-CM

## 2018-07-09 ENCOUNTER — Encounter: Admit: 2018-07-09 | Discharge: 2018-07-09 | Payer: MEDICARE

## 2018-07-09 MED ORDER — EZETIMIBE 10 MG PO TAB
ORAL_TABLET | Freq: Every day | 3 refills | Status: AC
Start: 2018-07-09 — End: 2019-07-01

## 2018-07-21 ENCOUNTER — Ambulatory Visit: Admit: 2018-07-21 | Discharge: 2018-07-21 | Payer: MEDICARE

## 2018-07-21 ENCOUNTER — Encounter: Admit: 2018-07-21 | Discharge: 2018-07-21 | Payer: MEDICARE

## 2018-07-21 DIAGNOSIS — Z8249 Family history of ischemic heart disease and other diseases of the circulatory system: ICD-10-CM

## 2018-07-21 DIAGNOSIS — K1321 Leukoplakia of oral mucosa, including tongue: ICD-10-CM

## 2018-07-21 DIAGNOSIS — J449 Chronic obstructive pulmonary disease, unspecified: ICD-10-CM

## 2018-07-21 DIAGNOSIS — I739 Peripheral vascular disease, unspecified: ICD-10-CM

## 2018-07-21 DIAGNOSIS — I10 Essential (primary) hypertension: ICD-10-CM

## 2018-07-21 DIAGNOSIS — G301 Alzheimer's disease with late onset: Principal | ICD-10-CM

## 2018-07-21 DIAGNOSIS — F0280 Dementia in other diseases classified elsewhere without behavioral disturbance: ICD-10-CM

## 2018-07-21 DIAGNOSIS — Z974 Presence of external hearing-aid: ICD-10-CM

## 2018-07-21 DIAGNOSIS — E039 Hypothyroidism, unspecified: Principal | ICD-10-CM

## 2018-07-21 DIAGNOSIS — M1990 Unspecified osteoarthritis, unspecified site: ICD-10-CM

## 2018-07-21 DIAGNOSIS — E785 Hyperlipidemia, unspecified: ICD-10-CM

## 2018-07-21 DIAGNOSIS — I2510 Atherosclerotic heart disease of native coronary artery without angina pectoris: ICD-10-CM

## 2018-07-21 DIAGNOSIS — L859 Epidermal thickening, unspecified: ICD-10-CM

## 2018-07-21 DIAGNOSIS — C61 Malignant neoplasm of prostate: ICD-10-CM

## 2018-07-21 LAB — VITAMIN B12: Lab: 230 pg/mL (ref 180–914)

## 2018-07-21 MED ORDER — DONEPEZIL 10 MG PO TAB
10 mg | ORAL_TABLET | Freq: Every day | ORAL | 5 refills | 90.00000 days | Status: AC
Start: 2018-07-21 — End: 2019-01-17

## 2018-07-22 ENCOUNTER — Encounter: Admit: 2018-07-22 | Discharge: 2018-07-22 | Payer: MEDICARE

## 2018-09-27 ENCOUNTER — Encounter: Admit: 2018-09-27 | Discharge: 2018-09-27 | Payer: MEDICARE

## 2018-10-01 ENCOUNTER — Ambulatory Visit: Admit: 2018-10-01 | Discharge: 2018-10-01 | Payer: MEDICARE

## 2018-10-01 DIAGNOSIS — I70219 Atherosclerosis of native arteries of extremities with intermittent claudication, unspecified extremity: Principal | ICD-10-CM

## 2018-10-01 DIAGNOSIS — Z95828 Presence of other vascular implants and grafts: ICD-10-CM

## 2018-10-12 ENCOUNTER — Encounter: Admit: 2018-10-12 | Discharge: 2018-10-12 | Payer: MEDICARE

## 2018-10-12 DIAGNOSIS — I739 Peripheral vascular disease, unspecified: Principal | ICD-10-CM

## 2018-12-02 ENCOUNTER — Ambulatory Visit: Admit: 2018-12-02 | Discharge: 2018-12-02 | Payer: MEDICARE

## 2018-12-02 ENCOUNTER — Encounter: Admit: 2018-12-02 | Discharge: 2018-12-02 | Payer: MEDICARE

## 2018-12-02 DIAGNOSIS — I2510 Atherosclerotic heart disease of native coronary artery without angina pectoris: Secondary | ICD-10-CM

## 2018-12-02 DIAGNOSIS — I10 Essential (primary) hypertension: Secondary | ICD-10-CM

## 2018-12-02 DIAGNOSIS — C61 Malignant neoplasm of prostate: Secondary | ICD-10-CM

## 2018-12-02 DIAGNOSIS — Z974 Presence of external hearing-aid: Secondary | ICD-10-CM

## 2018-12-02 DIAGNOSIS — F0150 Vascular dementia without behavioral disturbance: Secondary | ICD-10-CM

## 2018-12-02 DIAGNOSIS — E039 Hypothyroidism, unspecified: Secondary | ICD-10-CM

## 2018-12-02 DIAGNOSIS — K1321 Leukoplakia of oral mucosa, including tongue: Secondary | ICD-10-CM

## 2018-12-02 DIAGNOSIS — J449 Chronic obstructive pulmonary disease, unspecified: Secondary | ICD-10-CM

## 2018-12-02 DIAGNOSIS — Z8249 Family history of ischemic heart disease and other diseases of the circulatory system: Secondary | ICD-10-CM

## 2018-12-02 DIAGNOSIS — I739 Peripheral vascular disease, unspecified: Secondary | ICD-10-CM

## 2018-12-02 DIAGNOSIS — E785 Hyperlipidemia, unspecified: Secondary | ICD-10-CM

## 2018-12-02 DIAGNOSIS — L859 Epidermal thickening, unspecified: Secondary | ICD-10-CM

## 2018-12-02 DIAGNOSIS — M1990 Unspecified osteoarthritis, unspecified site: Secondary | ICD-10-CM

## 2018-12-03 ENCOUNTER — Encounter: Admit: 2018-12-03 | Discharge: 2018-12-03 | Payer: MEDICARE

## 2018-12-27 ENCOUNTER — Encounter: Admit: 2018-12-27 | Discharge: 2018-12-27 | Payer: MEDICARE

## 2018-12-27 DIAGNOSIS — I739 Peripheral vascular disease, unspecified: Principal | ICD-10-CM

## 2018-12-27 DIAGNOSIS — Z9889 Other specified postprocedural states: ICD-10-CM

## 2019-01-03 ENCOUNTER — Encounter: Admit: 2019-01-03 | Discharge: 2019-01-03 | Payer: MEDICARE

## 2019-01-04 ENCOUNTER — Ambulatory Visit: Admit: 2019-01-04 | Discharge: 2019-01-04 | Payer: MEDICARE

## 2019-01-04 ENCOUNTER — Encounter: Admit: 2019-01-04 | Discharge: 2019-01-04 | Payer: MEDICARE

## 2019-01-04 DIAGNOSIS — K1321 Leukoplakia of oral mucosa, including tongue: ICD-10-CM

## 2019-01-04 DIAGNOSIS — E039 Hypothyroidism, unspecified: Principal | ICD-10-CM

## 2019-01-04 DIAGNOSIS — I739 Peripheral vascular disease, unspecified: Principal | ICD-10-CM

## 2019-01-04 DIAGNOSIS — I2510 Atherosclerotic heart disease of native coronary artery without angina pectoris: ICD-10-CM

## 2019-01-04 DIAGNOSIS — M1990 Unspecified osteoarthritis, unspecified site: ICD-10-CM

## 2019-01-04 DIAGNOSIS — Z9889 Other specified postprocedural states: ICD-10-CM

## 2019-01-04 DIAGNOSIS — J984 Other disorders of lung: ICD-10-CM

## 2019-01-04 DIAGNOSIS — J449 Chronic obstructive pulmonary disease, unspecified: ICD-10-CM

## 2019-01-04 DIAGNOSIS — E785 Hyperlipidemia, unspecified: ICD-10-CM

## 2019-01-04 DIAGNOSIS — Z974 Presence of external hearing-aid: ICD-10-CM

## 2019-01-04 DIAGNOSIS — L859 Epidermal thickening, unspecified: ICD-10-CM

## 2019-01-04 DIAGNOSIS — C61 Malignant neoplasm of prostate: ICD-10-CM

## 2019-01-04 DIAGNOSIS — I10 Essential (primary) hypertension: ICD-10-CM

## 2019-01-04 DIAGNOSIS — Z8249 Family history of ischemic heart disease and other diseases of the circulatory system: ICD-10-CM

## 2019-01-04 DIAGNOSIS — I70219 Atherosclerosis of native arteries of extremities with intermittent claudication, unspecified extremity: Principal | ICD-10-CM

## 2019-01-04 NOTE — Progress Notes
Date of Service: 01/04/2019              Chief Complaint   Patient presents with   ??? Other     leg cramps       History of Present Illness  This is a very pleasant 84 year old gentleman with a history of coronary artery disease, COPD, hypertension and peripheral vascular disease.  He was initially referred to [place] for leg pain and had ABIs done which showed ABIs on the right of 0.6 and ABIs on the left of 0.74.  He was referred to Dr. Phil Dopp who evaluated him.  At the time his left leg seemed to be more symptomatic than the right.  He underwent additional testing and finally on January 31 of 2019 he had a left femoral to below-knee popliteal artery bypass using in situ greater saphenous vein graft and a left femoral and profunda femoral endarterectomy with bovine patch angioplasty.  Following surgery, on January 04, 2018 he had ABIs postop which showed an index on the right of 0.65 and on the left 1.03.    He had a recent emergency room visit due to right leg pain and he was referred back to [place].  At that point, he came back in and had ABIs which showed an index on the right of 0.56 and an index on the left of 1.12.    He is here today for a routine bypass graft surveillance ultrasound and ABIs.  His left femoropopliteal bypass graft is patent and without areas of significant stenosis.  His ABI on the right is 0.59 with a TBI of 0.42.  On the left, his ABI is 1.04 with a TBI of 0.9.  This is very similar to his results in July 2019. He had an ABI on the right of 0.59 with a TBI of 0.55 and an index on the left of 1.07 with a TBI of 1.08.    He also complains of right sided sacroiliac joint pain in his back.  He has knee pain in his right knee due to arthritis.  He has nocturnal cramping in his right calf.  Currently, he denies symptoms of illness.  He denies chest pain.  He does have COPD and has some shortness of breath with activity.  He denies any transient numbness, weakness or clumsiness of an extremity.  He denies any episodes of garbled speech, facial drooping or transient vision loss in either eye.  He denies any new or different abdomen, back or groin pain.  He denies abdominal pain associated with eating.  He basically does not walk much at all, and says his back pain stops him from walking far enough to have leg cramping when he walks.  He denies  rest pain.  He does have a history of a silent heart attack.  He has never had coronary angioplasty, stenting or bypass grafting.  He has never had a stroke.    He is not diabetic.  He does not smoke.  He does take aspirin and Pletal along with Lipitor daily.  He does not take Plavix.           Medical History:   Diagnosis Date   ??? CAD (coronary artery disease) 04/10/2009   ??? COPD (chronic obstructive pulmonary disease) (HCC)    ??? Dyslipidemia    ??? FHx: abdominal aortic aneurysm 04/10/2009   ??? HTN (hypertension) 04/10/2009   ??? Hyperkeratosis    ??? Hypothyroidism 04/10/2009   ??? Leukoplakia of oral cavity    ???  Lung disease 12/08/2018   ??? Osteoarthritis    ??? Peripheral artery disease (HCC)    ??? Prostate cancer (HCC) 04/10/2009   ??? Wears hearing aid in both ears        Surgical History:   Procedure Laterality Date   ??? KNEE REPLACEMENT Left 2000   ??? PROSTATECTOMY  08/02/1999    w/ bilateral pelvic lymph nodes   ??? INGUINAL HERNIA REPAIR Left 2010    x2   ??? COLONOSCOPY  10/02/2010   ??? CATARACT REMOVAL Left 12/2012   ??? CHOLECYSTECTOMY  2017   ??? CATHETER PLACEMENT ARTERIAL - ABDOMINAL/ PELVIC/ LOWER EXTREMITIY ARTERY - THIRD OR MORE BRANCH N/A 12/02/2017    Performed by Harley Alto, MD at CATH LAB   ??? LEFT FEMORAL TO POPLITEAL BYPASS Left 12/17/2017    Performed by Carlena Sax, DO at [facility]   ??? LEFT FEMORAL ENDARTERECTOMY Left 12/17/2017    Performed by Carlena Sax, DO at [facility]       Allergies:  No Known Allergies    Medication List:  ??? acetaminophen SR(+) (TYLENOL ARTHRITIS PAIN) 650 mg tablet Take 650 mg by mouth every 6 hours as needed for Pain.   ??? albuterol (VENTOLIN HFA) 90 mcg/actuation inhaler Inhale 2 puffs by mouth into the lungs every 6 hours as needed for Wheezing or Shortness of Breath. Shake well before use.   ??? albuterol-ipratropium (DUO-NEB) 0.5 mg-3 mg(2.5 mg base)/3 mL nebulizer solution Inhale 3 mL solution by nebulizer as directed three times daily.   ??? alendronate(+) (FOSAMAX) 5 mg tablet Take 5 mg by mouth daily before breakfast.   ??? aspirin EC 81 mg tablet Take 81 mg by mouth daily. Take with food.   ??? atorvastatin (LIPITOR) 80 mg tablet TAKE 1 TABLET EVERY DAY   ??? cholecalciferol (vitamin D3) (VITAMIN D3) 4,000 unit cap Take  by mouth.   ??? cilostazol(+) (PLETAL) 100 mg tablet Take 100 mg by mouth twice daily.     ??? cyanocobalamin (VITAMIN B-12, RUBRAMIN) 1,000 mcg/mL injection Inject 1 mL into the muscle every 7 days.   ??? diclofenac sodium (PENNSAID) 2 % sopk Apply  topically to affected area as Needed.   ??? donepezil (ARICEPT) 10 mg tablet Take one tablet by mouth daily.   ??? ezetimibe (ZETIA) 10 mg tablet TAKE 1 TABLET BY MOUTH ONCE DAILY   ??? fluticasone-salmeterol (ADVAIR DISKUS) 250-50 mcg/dose inhalation disk Inhale 1 puff by mouth into the lungs every 12 hours.   ??? Levothyroxine 125 mcg Cap Take 1 Tab by mouth Daily.   ??? meloxicam (MOBIC) 15 mg tablet Take 15 mg by mouth daily.   ??? MULTIVITAMIN PO Take  by mouth.   ??? Ramipril (ALTACE) 10 mg Tab Take 1 Tab by mouth Daily.   ??? terbinafine (LAMISIL) 250 mg tablet Take 250 mg by mouth daily.   ??? Triamcinolone Acetonide 0.5 % oint Apply 1 g topically to affected area three times daily. Apply to affected area three times daily   ??? vitamins, multi w/minerals tablet Take 2 tablets by mouth daily.       Social History:   reports that he quit smoking about 22 years ago. His smoking use included cigarettes. He has quit using smokeless tobacco. He reports current alcohol use of about 12.0 standard drinks of alcohol per week. He reports Body mass index is 30.67 kg/m???.     Physical Exam  Vitals signs and nursing note reviewed. Exam conducted with a chaperone present.  Constitutional:       General: He is not in acute distress.     Appearance: He is well-developed, well-groomed and overweight. He is not ill-appearing or diaphoretic.   HENT:      Head: Normocephalic.   Eyes:      General:         Right eye: No discharge.         Left eye: No discharge.   Neck:      Musculoskeletal: Neck supple.      Vascular: No carotid bruit.      Trachea: Phonation normal. No tracheal tenderness.   Cardiovascular:      Rate and Rhythm: Normal rate and regular rhythm.      Pulses:           Carotid pulses are 2+ on the right side and 2+ on the left side.       Radial pulses are 2+ on the right side and 2+ on the left side.        Femoral pulses are 2+ on the right side and 2+ on the left side.       Dorsalis pedis pulses are detected w/ Doppler on the right side and detected w/ Doppler on the left side.        Posterior tibial pulses are detected w/ Doppler on the right side and detected w/ Doppler on the left side.      Heart sounds: Normal heart sounds.      Comments: He has an easily palpable left medial leg bypass graft pulse.  He has no right leg pain.  Pulmonary:      Effort: Pulmonary effort is normal. No respiratory distress.      Breath sounds: Normal air entry. Examination of the right-lower field reveals rales. Examination of the left-lower field reveals rales. Rales present.      Comments: He does relate that he had pneumonia in December 2019.  Abdominal:      General: Bowel sounds are normal.      Palpations: Abdomen is soft.      Tenderness: There is no abdominal tenderness.   Musculoskeletal: Normal range of motion.      Right foot: Normal range of motion. No deformity.      Left foot: Normal range of motion. No deformity.   Feet:      Right foot:      Skin integrity: No ulcer, blister, skin breakdown or erythema.      Left foot: Skin integrity: No ulcer, blister, skin breakdown or erythema.   Skin:     General: Skin is warm and dry.      Capillary Refill: Capillary refill takes less than 2 seconds.   Neurological:      General: No focal deficit present.      Mental Status: He is alert. Mental status is at baseline.      Motor: Motor function is intact.      Coordination: Coordination is intact.      Gait: Gait is intact.   Psychiatric:         Attention and Perception: Attention normal.         Mood and Affect: Mood normal.         Speech: Speech normal.         Behavior: Behavior normal. Behavior is cooperative.         Thought Content: Thought content normal.  Assessment and Plan:    1. PVD (peripheral vascular disease) with claudication (HCC)     2. History of peripheral artery bypass     84 year old gentleman with a history of peripheral vascular disease and left leg peripheral artery bypass grafting.  He came in today for bypass ultrasound surveillance.  His bypass graft is patent without areas of significant stenosis.  His ABIs on the right are 1.59 and his ABI on the left is 1.04.  He has no rest pain and no claudication pain.  I discussed with both he and his wife his results and recommendations for follow-up.    Plan: Continue aspirin and Pletal along with Lipitor daily    He is now 1 year postop bypass grafting.  Recommend follow-up bypass graft ultrasound with ABIs in 6 months.    I recommended [facility].    I did give them information about a walking program for his visit summary.

## 2019-01-17 ENCOUNTER — Encounter: Admit: 2019-01-17 | Discharge: 2019-01-17 | Payer: MEDICARE

## 2019-01-17 MED ORDER — DONEPEZIL 10 MG PO TAB
ORAL_TABLET | Freq: Every day | ORAL | 1 refills | 90.00000 days | Status: AC
Start: 2019-01-17 — End: 2019-07-19

## 2019-06-09 ENCOUNTER — Encounter: Admit: 2019-06-09 | Discharge: 2019-06-09

## 2019-06-09 NOTE — Telephone Encounter
Received call from patients wife - they would like to reestablish care with Dr. Starleen Blue and are requesting an appt. Offered the patient 8/11 at 130pm. They are agreeable to appt time and location.

## 2019-06-28 ENCOUNTER — Encounter: Admit: 2019-06-28 | Discharge: 2019-06-28

## 2019-06-28 DIAGNOSIS — R0600 Dyspnea, unspecified: Secondary | ICD-10-CM

## 2019-06-28 DIAGNOSIS — I70219 Atherosclerosis of native arteries of extremities with intermittent claudication, unspecified extremity: Secondary | ICD-10-CM

## 2019-06-28 DIAGNOSIS — I739 Peripheral vascular disease, unspecified: Secondary | ICD-10-CM

## 2019-06-28 DIAGNOSIS — I10 Essential (primary) hypertension: Secondary | ICD-10-CM

## 2019-06-28 DIAGNOSIS — I2510 Atherosclerotic heart disease of native coronary artery without angina pectoris: Secondary | ICD-10-CM

## 2019-06-28 DIAGNOSIS — L859 Epidermal thickening, unspecified: Secondary | ICD-10-CM

## 2019-06-28 DIAGNOSIS — E039 Hypothyroidism, unspecified: Secondary | ICD-10-CM

## 2019-06-28 DIAGNOSIS — E785 Hyperlipidemia, unspecified: Secondary | ICD-10-CM

## 2019-06-28 DIAGNOSIS — Z8249 Family history of ischemic heart disease and other diseases of the circulatory system: Secondary | ICD-10-CM

## 2019-06-28 DIAGNOSIS — K1321 Leukoplakia of oral mucosa, including tongue: Secondary | ICD-10-CM

## 2019-06-28 DIAGNOSIS — M1990 Unspecified osteoarthritis, unspecified site: Secondary | ICD-10-CM

## 2019-06-28 DIAGNOSIS — C61 Malignant neoplasm of prostate: Secondary | ICD-10-CM

## 2019-06-28 DIAGNOSIS — J984 Other disorders of lung: Secondary | ICD-10-CM

## 2019-06-28 DIAGNOSIS — M791 Myalgia, unspecified site: Secondary | ICD-10-CM

## 2019-06-28 DIAGNOSIS — Z974 Presence of external hearing-aid: Secondary | ICD-10-CM

## 2019-06-28 DIAGNOSIS — J449 Chronic obstructive pulmonary disease, unspecified: Secondary | ICD-10-CM

## 2019-06-28 MED ORDER — ROSUVASTATIN 5 MG PO TAB
ORAL_TABLET | Freq: Every day | ORAL | 3 refills | 90.00000 days | Status: DC
Start: 2019-06-28 — End: 2019-06-28

## 2019-06-28 MED ORDER — ROSUVASTATIN 5 MG PO TAB
ORAL_TABLET | Freq: Every day | ORAL | 3 refills | 90.00000 days | Status: DC
Start: 2019-06-28 — End: 2019-12-13

## 2019-06-28 NOTE — Patient Instructions
Lets see if stopping the atorvastatin completely for 2 to 3 weeks will alleviate a lot of these fatigue and pain symptoms.  Do not take it at all for 2 to 3 weeks.  If there is no improvement whatsoever in 2 to 3 weeks is unlikely the atorvastatin is causing the problem.  Even so it might be that the atorvastatin effect is there and will take longer than that to wear off.  After 2 to 3 weeks even if you do not think you are any better start Crestor 5 mg 3 times a week.  It would be nice to get to 5 mg daily but let us start with 3 days a week and see what happens.  Stay with the Zetia.  That boost the power of rosuvastatin or any statin.  I have seen this provide dramatic benefits even when people are taking the statin every day.    You have about the leathery sound of pulmonary fibrosis when I listen to your lungs.  It is not extensive but it is definitely the fibrosis sounds when I listen to your lungs.  There is nothing wrong with just using the oxygen when you need to.  That lets all your muscles in your body including your breathing muscles good enough oxygen so that they do not get worn out.    I think it would be good for you to return for a recheck in three months.     I can see you sooner if needed.   Call in if you have problems or questions.   Lenoard Aden, MD

## 2019-06-28 NOTE — Progress Notes
Date of Service: 06/28/2019    Aaron Fuentes is a 84 y.o. male.       HPI       Aaron Fuentes presents for the first time in my clinic since 08/14/17.  Here with oxygen which is daughter and wife tell me he is use only when he is out of the house.  This is been recommended by Dr. Jaynie Collins his pulmonologist since early this year.  He saw Dr.Lem August 4  when Dr. Gerilyn Nestle added prednisone as a surge on recommended nocturnal oxygen and a new bronchodilator. He was started on CPAP by Dr. Andreas Newport in September 2019. His last CV hospitalization for the heart and vessels was in January 2019 when he was admitted for possible revascularization for PAD and was found to have disease too complex for PCI.   Dr. Carlena Sax Did left leg revascularization at the end of January 2019.  Leg pain assistant with claudication improved.    Fortunately even though the claudication improved he still has major systemic myalgias and total body aching.  This is what is prompting him to the basically wheelchair-bound when doing anything other than trivial ambulation.  This was not really an issue when I saw him previously.  Is been gradually worse and is now a compelling problem for him in addition to the shortness of breath from pulmonary fibrosis.  He does not have focal weakness just total aching makes it difficult for him to tolerate moving around on his own.  His claudication is resolved is not having angina.        Vitals:    06/28/19 1350   BP: 126/70   BP Source: Arm, Left Upper   Pulse: 99   Weight: 86.2 kg (190 lb)   Height: 1.778 m (5' 10)   PainSc: Zero     Body mass index is 27.26 kg/m???.     Past Medical History  Patient Active Problem List    Diagnosis Date Noted   ??? Major neurocognitive disorder due to Alzheimer's disease, probable, without behavioral disturbance (HCC) 12/02/2018     Initial visit with Dr. Elon Jester:   Syndrome of Cognition  Dementia Syndrome Present: Yes   Dementia Stage (if Present): mild Abnormal signs: Memory dysfunction and Executive dysfunction  Localization: Bihemispheral, cortices and Mesial temporal structures  Diagnosis: Alzheimer's disease  Differential diagnosis: Inflammatory disorders, Vascular disorders, Metabolic disorders and Other degenerative causes    CARE PLAN  Diagnostic tests: Recheck B12 level  Treatment: Other discussed interventions Increase donepezil to 10 mg. Limit etoh to 1 beer/day. Exercise.       ??? Left leg claudication (HCC) 12/17/2017   ??? PVD (peripheral vascular disease) with claudication (HCC) 12/02/2017   ??? Falls 09/30/2017   ??? Bilateral carotid artery disease (HCC) 01/22/2016     03/22/25 Carotid ultrasound  [facility]: mild disease, uncharged from prior     ??? Extremity atherosclerosis with intermittent claudication (HCC) 02/25/2011     11/21/10 Bilat leg [facility]: Right: Minimal reduction distal flow. Occluded L SFA w/ popliteal reconstitution. Occluded distal anterior Tib and DP arteries. Pletal tried, Dr Yetta Flock w/ improvement.      ??? Coronary artery disease due to lipid rich plaque 04/10/2009     a.  7/96 Dobut echo EF 60%, Poss inf hypo at rest, normal w. dobutamine all regions  b. 12/00-[facility] thallium: c/w limited inferior MI, EF 53%, lung/heart count 0.52, slightly high  c. 2001Cath. in [place] - mild  single vessel disease, probably RCA,  d. 3/02, [facility] stress echo, 8 min, 85% HR, 55% EF, No ischemia     ??? HTN (hypertension) 04/10/2009   ??? Hyperlipidemia 04/10/2009     a. 9/95 total 225 trig 129 HDL 27    LDL 172 baseline  b. 12/00 total 169 trig 189 HDL 34  LDL 97 Lipitor 10  c. 08/04/07 total 143 trig 80 HDL 36 LDLd  93 Lipitor 10 Niaspan1000  d. 12/21/07 total 136 trig 77 HDL 49, LDL 72 lipitor 10 niaspan 9562  E. 01/24/09 Total 144 trig 80 HDL 42 LDL 80 Lipitor 10, Niaspan 2 gm, increase lipitor to 40      ??? Hypothyroidism 04/10/2009     a. 12/00 TSH elevated 5.9, then 2/01 17.26 w/ free T4 borderline low 0.79     ??? Osteoarthritis 04/10/2009 a. 1999-Left knee replacement     ??? Prostate cancer (HCC) 04/10/2009     a. 9/00-prostatectomy  B. 2013: No recurrence, Dr. Larwance Rote.      ??? FHx: abdominal aortic aneurysm 04/10/2009      a. 08/02/07 Max diameter AA 2.0, normal           Review of Systems   Constitution: Positive for malaise/fatigue.   HENT: Positive for hearing loss.    Eyes: Positive for discharge and redness.   Cardiovascular: Positive for chest pain (tightness).   Respiratory: Positive for cough and shortness of breath.    Endocrine: Positive for cold intolerance.   Hematologic/Lymphatic: Negative.    Skin: Negative.    Musculoskeletal: Positive for back pain, joint pain and myalgias.   Gastrointestinal: Negative.    Genitourinary: Negative.    Neurological: Positive for dizziness and light-headedness.   Psychiatric/Behavioral: Negative.    Allergic/Immunologic: Positive for environmental allergies.   14 organ system review noted. It is negative except as reported in current narrative or above in the ROS section. This is a patient centered review of systems that was stated by the patient in his terms prior to my personal problem oriented interview with the patient     Physical Exam  General: Patient in no distress, looks healthy. Skin warm and dry, non icteric,    Mucous membranes moist  Pupils equal and round  Carotids: no bruits    Thyroid not enlarged.  Neck veins: CVP < 6 normal, no V wave, no HJR     Respiratory: Breathing comfortably. Lungs clear to percussion & auscultation. No rales, rhonchi or wheezing   Cardiac: Regular rhythm. LV impulse not palpable. Normal S1 & S2, Fourth heart sound, no rub or S3. No murmur  Abdomen soft, non-tender, no masses or bruits, No hepatic or aortic enlargement, Nomral bowel sounds.   Femoral arteries: Legs/feet: He has no edema. Motor: Normal muscle strength.      Cognitive: Good insight. No depression, good cognitive function limited spontaneous speech  Cardiovascular Studies EKG shows sinus rhythm rate 99 late transition no ischemia or infarct.    Problems Addressed Today  No diagnosis found.    Assessment and Plan     Aaron Fuentes has had several problems become more challenging for him since I have seen him last but his left leg pain did respond to revascularization surgery.  At this point his myalgias and sense of total body weakness are confining him to a wheelchair.  I am checking a CK today as this could be statin related.  Even if the CK is normal I want him to stop  the atorvastatin for at least 2 weeks to see if the muscle aching and fatigue start to improve.  If he sees some improvement I want him to stay off the statin and see how much further improvement he can get.  When he has seen clear-cut improvement then I want him to try a different statin at a lower dose once we see that the symptoms seem to be related to the atorvastatin to the atorvastatin.  I do not want to put him back on atorvastatin and will try statin that seems often well tolerated when often seems well tolerated when Lipitor is not.  I am and have him start on Crestor.  I like him to be on 5 mg daily initially starting at 5 mg Monday Wednesday and Friday if we decide to make the switch to atorvastatin.    If he sees absolutely no improvement in his symptoms after 2 to 3 weeks it is unlikely that the atorvastatin is actually the culprit but even so I want him to switch to lower dose Crestor just to see if there is any gradual improvement over time off the atorvastatin ultimately it may may be that he is troubled by myalgias with any statin.  In that case I will move him to Repatha PCSK9 inhibitor.  Generally 1 asked to failed 3 statins before pharmacy plans will cover Repatha.  We will deal with that if the situation arises.    There is a problem on the problem list related to a dementia.  Have not seen Aaron Fuentes in a couple of years now.  He did not have much spontaneous speech and most of the presentation of HPI was through his wife and daughter.  Him just noting this for the record as I think that discussing things face-to-face one-on-one with him may be of limited value in the future.    I Spent 45 minutes in face-to-face conference with Aaron Fuentes his daughter and wife discussing in detail the issues related to his concerns the treatment plan and likely outcomes.      NB: The free text in this document was generated through Dragon(TM) software with editing and proofreading  done by the author of this document Dr. Mable Paris MD, [facility] principally at the point of care. Some errors may persist.  If there are questions about content in this document please contact Dr. Hale Bogus.    The written information I provided Aaron Fuentes at the conclusion of today's encounter is as  follows:    Patient Instructions   Lets see if stopping the atorvastatin completely for 2 to 3 weeks will alleviate a lot of these fatigue and pain symptoms.  Do not take it at all for 2 to 3 weeks.  If there is no improvement whatsoever in 2 to 3 weeks is unlikely the atorvastatin is causing the problem.  Even so it might be that the atorvastatin effect is there and will take longer than that to wear off.  After 2 to 3 weeks even if you do not think you are any better start Crestor 5 mg 3 times a week.  It would be nice to get to 5 mg daily but let us start with 3 days a week and see what happens.  Stay with the Zetia.  That boost the power of rosuvastatin or any statin.  I have seen this provide dramatic benefits even when people are taking the statin every day.    You have about the  leathery sound of pulmonary fibrosis when I listen to your lungs.  It is not extensive but it is definitely the fibrosis sounds when I listen to your lungs.  There is nothing wrong with just using the oxygen when you need to.  That lets all your muscles in your body including your breathing muscles good enough oxygen so that they do not get worn out.    I think it would be good for you to return for a recheck in three months.     I can see you sooner if needed.   Call in if you have problems or questions.   Marissa Nestle, MD                    Current Medications (including today's revisions)  ??? acetaminophen SR(+) (TYLENOL ARTHRITIS PAIN) 650 mg tablet Take 650 mg by mouth every 6 hours as needed for Pain.   ??? albuterol (VENTOLIN HFA) 90 mcg/actuation inhaler Inhale 2 puffs by mouth into the lungs every 6 hours as needed for Wheezing or Shortness of Breath. Shake well before use.   ??? albuterol-ipratropium (DUO-NEB) 0.5 mg-3 mg(2.5 mg base)/3 mL nebulizer solution Inhale 3 mL solution by nebulizer as directed three times daily.   ??? alendronate(+) (FOSAMAX) 5 mg tablet Take 5 mg by mouth daily before breakfast.   ??? aspirin EC 81 mg tablet Take 81 mg by mouth daily. Take with food.   ??? atorvastatin (LIPITOR) 80 mg tablet TAKE 1 TABLET EVERY DAY   ??? cholecalciferol (vitamin D3) (VITAMIN D3) 4,000 unit cap Take  by mouth.   ??? cilostazol(+) (PLETAL) 100 mg tablet Take 100 mg by mouth twice daily.     ??? cyanocobalamin (VITAMIN B-12, RUBRAMIN) 1,000 mcg/mL injection Inject 1 mL into the muscle every 7 days.   ??? diclofenac sodium (PENNSAID) 2 % sopk Apply  topically to affected area as Needed.   ??? donepeziL (ARICEPT) 10 mg tablet Take 1 tablet by mouth once daily   ??? ezetimibe (ZETIA) 10 mg tablet TAKE 1 TABLET BY MOUTH ONCE DAILY   ??? fluticasone-salmeterol (ADVAIR DISKUS) 250-50 mcg/dose inhalation disk Inhale 1 puff by mouth into the lungs every 12 hours.   ??? Levothyroxine 125 mcg Cap Take 1 Tab by mouth Daily.   ??? meloxicam (MOBIC) 15 mg tablet Take 15 mg by mouth daily.   ??? MULTIVITAMIN PO Take  by mouth.   ??? Ramipril (ALTACE) 10 mg Tab Take 1 Tab by mouth Daily.   ??? terbinafine (LAMISIL) 250 mg tablet Take 250 mg by mouth daily. ??? Triamcinolone Acetonide 0.5 % oint Apply 1 g topically to affected area three times daily. Apply to affected area three times daily   ??? vitamins, multi w/minerals tablet Take 2 tablets by mouth daily.

## 2019-06-29 ENCOUNTER — Ambulatory Visit: Admit: 2019-06-28 | Discharge: 2019-06-29

## 2019-06-29 DIAGNOSIS — R52 Pain, unspecified: Secondary | ICD-10-CM

## 2019-06-29 DIAGNOSIS — I10 Essential (primary) hypertension: Secondary | ICD-10-CM

## 2019-07-01 ENCOUNTER — Encounter: Admit: 2019-07-01 | Discharge: 2019-07-01

## 2019-07-01 DIAGNOSIS — Z9889 Other specified postprocedural states: Secondary | ICD-10-CM

## 2019-07-01 DIAGNOSIS — I739 Peripheral vascular disease, unspecified: Secondary | ICD-10-CM

## 2019-07-01 MED ORDER — EZETIMIBE 10 MG PO TAB
ORAL_TABLET | Freq: Every day | 3 refills | Status: AC
Start: 2019-07-01 — End: ?

## 2019-07-11 ENCOUNTER — Encounter: Admit: 2019-07-11 | Discharge: 2019-07-11

## 2019-07-11 ENCOUNTER — Ambulatory Visit: Admit: 2019-07-11 | Discharge: 2019-07-12

## 2019-07-11 ENCOUNTER — Ambulatory Visit: Admit: 2019-07-11 | Discharge: 2019-07-11

## 2019-07-11 DIAGNOSIS — I739 Peripheral vascular disease, unspecified: Secondary | ICD-10-CM

## 2019-07-11 DIAGNOSIS — Z8249 Family history of ischemic heart disease and other diseases of the circulatory system: Secondary | ICD-10-CM

## 2019-07-11 DIAGNOSIS — K1321 Leukoplakia of oral mucosa, including tongue: Secondary | ICD-10-CM

## 2019-07-11 DIAGNOSIS — Z9889 Other specified postprocedural states: Secondary | ICD-10-CM

## 2019-07-11 DIAGNOSIS — J449 Chronic obstructive pulmonary disease, unspecified: Secondary | ICD-10-CM

## 2019-07-11 DIAGNOSIS — I6523 Occlusion and stenosis of bilateral carotid arteries: Secondary | ICD-10-CM

## 2019-07-11 DIAGNOSIS — I2510 Atherosclerotic heart disease of native coronary artery without angina pectoris: Secondary | ICD-10-CM

## 2019-07-11 DIAGNOSIS — Z974 Presence of external hearing-aid: Secondary | ICD-10-CM

## 2019-07-11 DIAGNOSIS — I10 Essential (primary) hypertension: Secondary | ICD-10-CM

## 2019-07-11 DIAGNOSIS — C61 Malignant neoplasm of prostate: Secondary | ICD-10-CM

## 2019-07-11 DIAGNOSIS — M1990 Unspecified osteoarthritis, unspecified site: Secondary | ICD-10-CM

## 2019-07-11 DIAGNOSIS — J984 Other disorders of lung: Secondary | ICD-10-CM

## 2019-07-11 DIAGNOSIS — L859 Epidermal thickening, unspecified: Secondary | ICD-10-CM

## 2019-07-11 DIAGNOSIS — E785 Hyperlipidemia, unspecified: Secondary | ICD-10-CM

## 2019-07-11 DIAGNOSIS — I70223 Atherosclerosis of native arteries of extremities with rest pain, bilateral legs: Secondary | ICD-10-CM

## 2019-07-11 DIAGNOSIS — Z136 Encounter for screening for cardiovascular disorders: Secondary | ICD-10-CM

## 2019-07-11 DIAGNOSIS — E039 Hypothyroidism, unspecified: Secondary | ICD-10-CM

## 2019-07-11 NOTE — Progress Notes
Date of Service: 07/11/2019              Chief Complaint   Patient presents with   ??? PVD   ??? Ultrasound     6 mo f/u        History of Present Illness  This is a very pleasant 84 year old gentleman with a history of coronary artery disease, COPD, hypertension and peripheral vascular disease.  He was initially referred to [place] for leg pain and had ABIs done which showed ABIs on the right of 0.6 and ABIs on the left of 0.74.  He was referred to Dr. Phil Dopp who evaluated him.  At the time his left leg seemed to be more symptomatic than the right.  He underwent additional testing and finally on January 31 of 2019 he had a left femoral to below-knee popliteal artery bypass using in situ greater saphenous vein graft and a left femoral and profunda femoral endarterectomy with bovine patch angioplasty.  Following surgery, on January 04, 2018 he had ABIs postop which showed an index on the right of 0.65 and on the left 1.03.    He had an emergency room visit around 09/2018  due to right leg pain and he was referred back to [place].  At that point, he came back in and had ABIs which showed an index on the right of 0.56 and an index on the left of 1.12.    He is here today for left leg bypass graft ultrasound with ABIs.  His bypass graft is widely patent with no areas of significant stenosis.  His ABIs on the right are 0.57 with a TBI of 0.28 and he has a normal ABI on the left at 1.21 with a TBI of 0.99.  Previously, in February 2020, his ABI on the right was 0.59 and on the left was 1.04.    Currently, he denies symptoms of illness.  He denies chest pain.  He does have COPD and has some shortness of breath with activity.  He denies any transient numbness, weakness or clumsiness of an extremity.  He denies any episodes of garbled speech, facial drooping or transient vision loss in either eye.  He denies any new or different abdomen, back or groin pain.  He denies abdominal pain associated with eating.  He basically does not walk much at all, and says his back pain stops him from walking far enough to have leg cramping when he walks.  He denies  rest pain.  He does have a history of a silent heart attack.  He has never had coronary angioplasty, stenting or bypass grafting.  He has never had a stroke.    He is not diabetic.  He does not smoke.  He does take aspirin and Pletal along with Zetia  daily.  His atorvastatin was discontinued to see if that was affecting his generalized muscle weakness.  The plan is to get him started on Crestor on September 1.  They state his generalized weakness is not significantly different since stopping the atorvastatin.           Medical History:   Diagnosis Date   ??? CAD (coronary artery disease) 04/10/2009   ??? COPD (chronic obstructive pulmonary disease) (HCC)    ??? Dyslipidemia    ??? FHx: abdominal aortic aneurysm 04/10/2009   ??? HTN (hypertension) 04/10/2009   ??? Hyperkeratosis    ??? Hypothyroidism 04/10/2009   ??? Leukoplakia of oral cavity    ??? Lung disease 12/08/2018   ???  Osteoarthritis    ??? Peripheral artery disease (HCC)    ??? Prostate cancer (HCC) 04/10/2009   ??? Wears hearing aid in both ears        Surgical History:   Procedure Laterality Date   ??? KNEE REPLACEMENT Left 2000   ??? PROSTATECTOMY  08/02/1999    w/ bilateral pelvic lymph nodes   ??? INGUINAL HERNIA REPAIR Left 2010    x2   ??? COLONOSCOPY  10/02/2010   ??? CATARACT REMOVAL Left 12/2012   ??? CHOLECYSTECTOMY  2017   ??? CATHETER PLACEMENT ARTERIAL - ABDOMINAL/ PELVIC/ LOWER EXTREMITIY ARTERY - THIRD OR MORE BRANCH N/A 12/02/2017    Performed by Harley Alto, MD at [facility] CATH LAB   ??? LEFT FEMORAL TO POPLITEAL BYPASS Left 12/17/2017    Performed by Carlena Sax, DO at [facility] OR   ??? LEFT FEMORAL ENDARTERECTOMY Left 12/17/2017    Performed by Carlena Sax, DO at [facility] OR       Allergies:  No Known Allergies    Medication List:  ??? acetaminophen SR(+) (TYLENOL ARTHRITIS PAIN) 650 mg tablet Take 650 mg by mouth every 6 hours as needed for Pain. ??? albuterol-ipratropium (DUO-NEB) 0.5 mg-3 mg(2.5 mg base)/3 mL nebulizer solution Inhale 3 mL solution by nebulizer as directed three times daily.   ??? alendronate(+) (FOSAMAX) 5 mg tablet Take 5 mg by mouth every 7 days.   ??? aspirin EC 81 mg tablet Take 81 mg by mouth daily. Take with food.   ??? calcium carbonate (CALCIUM 600 PO) Take 1 capsule by mouth twice daily.   ??? cholecalciferol (VITAMIN D-3) 1,000 units tablet Take 1,000 Units by mouth twice daily.   ??? cilostazol(+) (PLETAL) 100 mg tablet Take 100 mg by mouth twice daily.     ??? Cinnamon Bark (CINNAMON) 500 mg cap Take 1 capsule by mouth twice daily.   ??? cyanocobalamin (VITAMIN B-12, RUBRAMIN) 1,000 mcg/mL injection Inject 1 mL into the muscle every 30 days.   ??? diclofenac sodium (PENNSAID) 2 % sopk Apply  topically to affected area as Needed.   ??? donepeziL (ARICEPT) 10 mg tablet Take 1 tablet by mouth once daily   ??? ezetimibe (ZETIA) 10 mg tablet Take 1 tablet by mouth once daily   ??? fluticasone propionate (FLONASE) 50 mcg/actuation nasal spray, suspension Apply 2 sprays to each nostril as directed daily. Shake bottle gently before using.   ??? ibuprofen (ADVIL) 200 mg tablet Take 200 mg by mouth three times daily. Take with food.   ??? Levothyroxine 125 mcg Cap Take 1 Tab by mouth Daily.   ??? Magnesium 250 mg tab Take 1 tablet by mouth at bedtime daily.   ??? meloxicam (MOBIC) 15 mg tablet Take 15 mg by mouth daily.   ??? other medication Take  by mouth. Teavigo 150 mg- 1 tab by mouth in the AM, 1 tab at [facility] and 2 tabs in the PM   ??? pirfenidone (ESBRIET) 267 mg capsule Take 534 mg by mouth three times daily. Take 1 cap by mouth 3 times/day for 1 week, take 2 caps by mouth 3 times/day for 1 week, then take 3 caps 3 times/day. Take with meals.   ??? predniSONE (DELTASONE) 10 mg tablet Take 10 mg by mouth daily with breakfast.   ??? rosuvastatin (CRESTOR) 5 mg tablet Take 1 daily or as directed   ??? tiotropium-olodateroL (STIOLTO RESPIMAT) 2.5-2.5 mcg/actuation inhaler Inhale 2 puffs by mouth into the lungs daily.   ??? Triamcinolone Acetonide 0.5 %  oint Apply 1 g topically to affected area three times daily. Apply to affected area three times daily       Social History:   reports that he quit smoking about 23 years ago. His smoking use included cigarettes. He has quit using smokeless tobacco. He reports previous alcohol use. He reports that he does not use drugs.    Family History   Problem Relation Age of Onset   ??? Sudden Cardiac Death Mother    ??? Cancer-Breast Mother    ??? Hypertension Mother    ??? Hypertension Father    ??? Dementia Father    ??? Cancer-Breast Sister    ??? Cancer Brother         throat   ??? Liver Cancer Brother    ??? Dementia Brother        Review of Systems   Constitutional: Positive for activity change, appetite change, fatigue and unexpected weight change. Negative for chills, diaphoresis and fever.   HENT: Positive for hearing loss, postnasal drip, rhinorrhea and sneezing. Negative for nosebleeds and tinnitus.    Eyes: Negative for photophobia, itching and visual disturbance.   Respiratory: Positive for apnea, cough, shortness of breath and wheezing. Negative for chest tightness.    Cardiovascular: Positive for palpitations. Negative for chest pain and leg swelling.   Gastrointestinal: Negative for abdominal pain, blood in stool, constipation, diarrhea, nausea and vomiting.   Endocrine: Negative for cold intolerance and heat intolerance.   Genitourinary: Negative for dysuria, frequency and hematuria.   Musculoskeletal: Positive for arthralgias, back pain, gait problem and joint swelling. Negative for myalgias.   Skin: Negative for color change, rash and wound.   Allergic/Immunologic: Positive for environmental allergies. Negative for food allergies and immunocompromised state.   Neurological: Positive for weakness. Negative for dizziness, tremors, seizures, syncope, facial asymmetry, speech difficulty, light-headedness and headaches. Hematological: Negative for adenopathy. Bruises/bleeds easily.   Psychiatric/Behavioral: Positive for agitation, confusion and decreased concentration. Negative for behavioral problems and dysphoric mood. The patient is nervous/anxious and is hyperactive.                Vitals:    07/11/19 1416 07/11/19 1417   BP: 117/79 114/71   BP Source: Arm, Left Upper Arm, Right Upper   Patient Position: Sitting Sitting   Pulse: 106 108   Weight: 85.7 kg (189 lb)    Height: 172.7 cm (68)      Body mass index is 28.74 kg/m???.     Physical Exam  Vitals signs and nursing note reviewed. Exam conducted with a chaperone present.   Constitutional:       General: He is not in acute distress.     Appearance: Normal appearance. He is well-groomed and overweight. He is not diaphoretic.   HENT:      Head: Normocephalic.   Eyes:      Conjunctiva/sclera:      Right eye: Right conjunctiva is not injected.      Left eye: Left conjunctiva is not injected.   Neck:      Musculoskeletal: Neck supple.      Vascular: No carotid bruit.      Trachea: Phonation normal.      Comments: I do not hear carotid bruits but he has quite a bit of pulmonary noise.  Cardiovascular:      Rate and Rhythm: Regular rhythm. Tachycardia present.      Pulses:           Carotid pulses are 2+ on  the right side and 2+ on the left side.       Radial pulses are 2+ on the right side and 2+ on the left side.        Dorsalis pedis pulses are detected w/ Doppler on the right side and detected w/ Doppler on the left side.        Posterior tibial pulses are detected w/ Doppler on the right side and detected w/ Doppler on the left side.      Heart sounds: Normal heart sounds.      Comments: His heart rate is 106-108  Pulmonary:      Effort: Pulmonary effort is normal. Prolonged expiration present. No respiratory distress.      Breath sounds: Transmitted upper airway sounds present. Examination of the right-middle field reveals rales. Examination of the left-middle field reveals rales. Examination of the right-lower field reveals rales. Examination of the left-lower field reveals rales. Rales present.      Comments: He is being followed by pulmonary for pulmonary fibrosis.  Skin:     General: Skin is warm and dry.      Capillary Refill: Capillary refill takes less than 2 seconds.      Coloration: Skin is not cyanotic or mottled.   Neurological:      General: No focal deficit present.      Mental Status: He is alert.      Motor: Motor function is intact.      Coordination: Coordination is intact.      Gait: Gait is intact.   Psychiatric:         Attention and Perception: Attention and perception normal.         Mood and Affect: Mood and affect normal.         Speech: Speech normal.         Behavior: Behavior normal. Behavior is cooperative.         Thought Content: Thought content normal.         Right Brachial Systolic: 104   Left Brachial Systolic: 101   Right ABI: 0.57  Left ABI: 1.21      Assessment and Plan:    1. Atherosclerosis of native artery of both lower extremities with rest pain (HCC)  [place] NONINVASIVE LOWER EXT PVR   2. History of peripheral artery bypass  [place] NONINVASIVE LOWER EXT PVR   3. Encounter for screening for stenosis of carotid artery  US DUPLEX SCAN CAROTID BILATERAL   4. Bilateral carotid artery stenosis   US DUPLEX SCAN CAROTID BILATERAL   This is an 84 year old gentleman who is in today for bypass graft surveillance ultrasound with ABIs and an office visit.  His bypass graft is widely patent with no areas of significant stenosis.  His ABIs on the right are 0.57 with a TBI of 0.28 and he has a normal ABI on the left at 1.21 with a TBI of 0.99.  Previously, in February 2020, his ABI on the right was 0.59 and on the left was 1.04.  He has a history of pulmonary fibrosis and cannot walk very far due to dyspnea with exertion related to his COPD.  He says his cramping pain is so much better since his procedure.  I discussed with him his results and recommendations for follow-up.  Because of airway noise, and because we have no record of testing, I am also going to recommend a carotid ultrasound at his next follow-up visit.    Plan: Continue aspirin,  Pletal and Zetia daily.  They are supposed to start Crestor on September 1.    We do not need to reevaluate his bypass graft for another year.  However, because of decreased ABIs on the right, we will obtain ABIs in 6 months.  We will also obtain a carotid ultrasound at that time with an office visit to discuss results.    I gave him information about leg ischemia for his visit summary.

## 2019-07-19 ENCOUNTER — Encounter: Admit: 2019-07-19 | Discharge: 2019-07-19

## 2019-07-19 MED ORDER — DONEPEZIL 10 MG PO TAB
10 mg | ORAL_TABLET | Freq: Every day | ORAL | 0 refills | 90.00000 days | Status: DC
Start: 2019-07-19 — End: 2019-07-27

## 2019-07-21 ENCOUNTER — Encounter: Admit: 2019-07-21 | Discharge: 2019-07-21

## 2019-07-27 ENCOUNTER — Encounter: Admit: 2019-07-27 | Discharge: 2019-07-27

## 2019-07-27 ENCOUNTER — Ambulatory Visit: Admit: 2019-07-27 | Discharge: 2019-07-27

## 2019-07-27 DIAGNOSIS — M1990 Unspecified osteoarthritis, unspecified site: Secondary | ICD-10-CM

## 2019-07-27 DIAGNOSIS — J449 Chronic obstructive pulmonary disease, unspecified: Secondary | ICD-10-CM

## 2019-07-27 DIAGNOSIS — G301 Alzheimer's disease with late onset: Secondary | ICD-10-CM

## 2019-07-27 DIAGNOSIS — E785 Hyperlipidemia, unspecified: Secondary | ICD-10-CM

## 2019-07-27 DIAGNOSIS — J984 Other disorders of lung: Secondary | ICD-10-CM

## 2019-07-27 DIAGNOSIS — Z8249 Family history of ischemic heart disease and other diseases of the circulatory system: Secondary | ICD-10-CM

## 2019-07-27 DIAGNOSIS — I10 Essential (primary) hypertension: Secondary | ICD-10-CM

## 2019-07-27 DIAGNOSIS — R4189 Other symptoms and signs involving cognitive functions and awareness: Secondary | ICD-10-CM

## 2019-07-27 DIAGNOSIS — E039 Hypothyroidism, unspecified: Secondary | ICD-10-CM

## 2019-07-27 DIAGNOSIS — R413 Other amnesia: Secondary | ICD-10-CM

## 2019-07-27 DIAGNOSIS — L859 Epidermal thickening, unspecified: Secondary | ICD-10-CM

## 2019-07-27 DIAGNOSIS — C61 Malignant neoplasm of prostate: Secondary | ICD-10-CM

## 2019-07-27 DIAGNOSIS — Z974 Presence of external hearing-aid: Secondary | ICD-10-CM

## 2019-07-27 DIAGNOSIS — I739 Peripheral vascular disease, unspecified: Secondary | ICD-10-CM

## 2019-07-27 DIAGNOSIS — I2510 Atherosclerotic heart disease of native coronary artery without angina pectoris: Secondary | ICD-10-CM

## 2019-07-27 DIAGNOSIS — K1321 Leukoplakia of oral mucosa, including tongue: Secondary | ICD-10-CM

## 2019-07-27 MED ORDER — DONEPEZIL 10 MG PO TAB
10 mg | ORAL_TABLET | Freq: Every day | ORAL | 3 refills | 90.00000 days | Status: AC
Start: 2019-07-27 — End: ?

## 2019-07-27 NOTE — Progress Notes
Date of Service: 07/27/2019    Subjective:             Aaron Fuentes is a 84 y.o. male.    Obtained patient's verbal consent to treat them and their agreement to [facility] financial policy and NPP via this telehealth visit during the [facility] Emergency. Zoom.      History of Present Illness  Aaron Fuentes is a 84 y.o. followed for issues of cognition.   History today obtained from: patient and wife Aaron Fuentes). Daughter, Aaron Fuentes. He goes by Aaron Fuentes.  Current Diagnosis: AD  Last Seen: Me in 9/19; Michelle in 1/20.    COGNITION/BEHAVIOR-FOCUSED HISTORY  Interval Cognition/Behavioral Focused History: Course c/o by lung issues; these terminated him going to work and church anymore. SI joint problems. Pulmonary rehab. Aaron Fuentes says his cognition has declined. Less conversational and interactive. Harder for him to keep track of things. He still plays poker, though, and Aaron Fuentes says his game is still good. Gets out of the house with [place]. Plays [employer] every night. He has become somewhat demanding, and antsy. harder for him to focus. Frequently has bad dreams (takes donepezil in AM).  Specific Neuropsychiatric/Behavioral Symptoms and PHQ2:       PHQ-2 Score: 0 (07/27/2019 12:22 PM)    Other Interval Details: Expressed research interest, but has not been able to participate as he has very frequent med adjustments.    FUNCTIONAL ASSESSMENT  Independent in IADLs: No  Independent in BADLs: Yes   Reduced Decision Making Capacity: Yes   Home or Driving Safety Issues: No    INTERVAL EVALUATIONS  Labs: Per O2  Imaging: Per O2  Neuropsych Testing:  Other Tests:    OTHER INTERVAL INFORMATION  Relevant Interval Medical History: Since last visit had pneumonia. Referred to a pulmonologist (saw Dr. Gerilyn Nestle). Dx'd with interstitial lung dz. Frequently uses O2.  Relevant/High Risk Cognition-Affecting Medications and Interval Med Changes: donepezil 10  Relevant Interval Family History: No changes Relevant Interval Social History: No major changes.  Relevant Interval ROS:     Review of Systems   Constitutional: Negative for chills.   HENT: Negative for facial swelling.    Eyes: Negative for discharge.   Respiratory: Negative for choking.    Cardiovascular: Negative for palpitations.   Gastrointestinal: Negative for vomiting.   Genitourinary: Negative for flank pain.   Skin: Negative for wound.   Neurological: Negative for seizures.   Hematological: Negative for adenopathy.   Psychiatric/Behavioral: Negative for self-injury.         Objective:         ??? acetaminophen SR(+) (TYLENOL ARTHRITIS PAIN) 650 mg tablet Take 650 mg by mouth every 6 hours as needed for Pain.   ??? albuterol-ipratropium (DUO-NEB) 0.5 mg-3 mg(2.5 mg base)/3 mL nebulizer solution Inhale 3 mL solution by nebulizer as directed three times daily.   ??? alendronate(+) (FOSAMAX) 5 mg tablet Take 5 mg by mouth every 7 days.   ??? aspirin EC 81 mg tablet Take 81 mg by mouth daily. Take with food.   ??? calcium carbonate (CALCIUM 600 PO) Take 1 capsule by mouth twice daily.   ??? cholecalciferol (VITAMIN D-3) 1,000 units tablet Take 1,000 Units by mouth twice daily.   ??? cilostazol(+) (PLETAL) 100 mg tablet Take 100 mg by mouth twice daily.     ??? Cinnamon Bark (CINNAMON) 500 mg cap Take 1 capsule by mouth twice daily.   ??? cyanocobalamin (VITAMIN B-12, RUBRAMIN) 1,000 mcg/mL injection Inject 1 mL into the  muscle every 21 days.   ??? diclofenac sodium (PENNSAID TP) Apply  topically to affected area twice daily.   ??? donepeziL (ARICEPT) 10 mg tablet Take one tablet by mouth daily.   ??? ezetimibe (ZETIA) 10 mg tablet Take 1 tablet by mouth once daily   ??? fluticasone propionate (FLONASE) 50 mcg/actuation nasal spray, suspension Apply 2 sprays to each nostril as directed daily. Shake bottle gently before using.   ??? homeopathic drugs (ALLERGY CM PO) Take 10 mg by mouth daily.   ??? ibuprofen (ADVIL) 200 mg tablet Take 200 mg by mouth three times daily. Take with food. ??? Levothyroxine 125 mcg Cap Take 1 Tab by mouth Daily.   ??? Magnesium 250 mg tab Take 1 tablet by mouth at bedtime daily.   ??? meloxicam (MOBIC) 15 mg tablet Take 15 mg by mouth daily.   ??? other medication Take  by mouth. Teavigo 150 mg- 1 tab by mouth in the AM, 1 tab at [facility] and 2 tabs in the PM   ??? pirfenidone (ESBRIET) 267 mg capsule Take 534 mg by mouth three times daily. Take 1 cap by mouth 3 times/day for 1 week, take 2 caps by mouth 3 times/day for 1 week, then take 3 caps 3 times/day. Take with meals.   ??? predniSONE (DELTASONE) 5 mg tablet Take 5 mg by mouth daily with breakfast.   ??? rosuvastatin (CRESTOR) 5 mg tablet Take 1 daily or as directed   ??? Triamcinolone Acetonide 0.5 % oint Apply 1 g topically to affected area three times daily. Apply to affected area three times daily     There were no vitals filed for this visit.  There is no height or weight on file to calculate BMI.     Physical Exam  COGNITION-FOCUSED EXAM (INCLUDES STANDARDIZED INSTRUMENTS)  Overall state: NAD    MMSE AND ORIENTATION  Person: Yes   Place: No Misses [place], [place]'s location  Time: Yes   Time Questions: 5/5  Place questions: 3/5  Repeat 3 words: 3/3  Name and repeat 3/3  WORLD backwards (or equivalent task): 5/5  3 step command: 3/3  Recall 3 words: 1/3  Read, draw, write a sentence: 2/3 Misses bender diagram  TOTAL: 25/30 (was 26/30 in 9/19)    Memory  ECR first pass encoding: 4/4 4/4 4/4 4/4  ECR delayed free recall: 2/16 (2/16 last year)  ECR delayed cued recall: 11/16 (10/16 last year)  Teena Dunk Figure:    Visuospatial  Clock drawing: 5/7 numbers not in place relative to the circle, arms not proportional  (6/7 last year)    Language  Animal naming number: 7 (10 last year)  Animal naming perserverations: 5  Confrontational Naming: Misses hammock (15/16) (16/16 last year)    Other Cognitive Exam  Affect: Full  Thought content: restricted  Thought processes: not grossly psychotic Gross language comprehension and expression skills: grossly good  Other Cognitive Testing: 6.75=27  Last year: 49/60; 11/22, 15/23, 13/15    Non-Cognitive Exam Findings  Gait:  Other: Displaying pursed-lip breathing; he is clearly short of breath       Assessment and Plan:  FORMULATION  Syndrome of: cognition  Dementia Syndrome Present: Yes   Dementia Stage (if Present): mild  Diagnosis: AD  Other Diagnostic Considerations: Not much change on today's exam, other than appearing a bit more dysexecutive    CARE PLAN  Diagnostic tests: No further tests at this time  Treatment Plan: continue donepezil. If he shows more pervasive  cognitive decline can consider memantine.  Specific Treatments for Neuropsychiatric Symptoms Initiated/Continued: No If he develops worsening antsiness we can consider sertraline.  Referral to Specific Community Resources Needed: Yes   Advanced Care/Palliative Planning Pertinent or Specifically Requested: No  Access to Qualified Caregiver (with adequate knowledge, support, willingness): Yes   Care Plan Shared with Patient/Caregiver: Yes   Follow Up: 6 mo with NP, 1 year with me  Total Visit Time: ~50 min  Time spent in face to face counseling and coordination of care:  Topics discussed: dx prognosis management    MEDICAL DECISION MAKING  Medical decision making: moderate or severe

## 2019-11-21 ENCOUNTER — Encounter: Admit: 2019-11-21 | Discharge: 2019-11-21 | Payer: MEDICARE

## 2019-11-21 DIAGNOSIS — R778 Other specified abnormalities of plasma proteins: Secondary | ICD-10-CM

## 2019-11-29 ENCOUNTER — Encounter: Admit: 2019-11-29 | Discharge: 2019-11-29 | Payer: MEDICARE

## 2019-12-08 ENCOUNTER — Encounter: Admit: 2019-12-08 | Discharge: 2019-12-08 | Payer: MEDICARE

## 2019-12-13 ENCOUNTER — Encounter: Admit: 2019-12-13 | Discharge: 2019-12-13 | Payer: MEDICARE

## 2019-12-13 DIAGNOSIS — L859 Epidermal thickening, unspecified: Secondary | ICD-10-CM

## 2019-12-13 DIAGNOSIS — Z8249 Family history of ischemic heart disease and other diseases of the circulatory system: Secondary | ICD-10-CM

## 2019-12-13 DIAGNOSIS — I10 Essential (primary) hypertension: Secondary | ICD-10-CM

## 2019-12-13 DIAGNOSIS — I2510 Atherosclerotic heart disease of native coronary artery without angina pectoris: Secondary | ICD-10-CM

## 2019-12-13 DIAGNOSIS — J449 Chronic obstructive pulmonary disease, unspecified: Secondary | ICD-10-CM

## 2019-12-13 DIAGNOSIS — R4189 Other symptoms and signs involving cognitive functions and awareness: Secondary | ICD-10-CM

## 2019-12-13 DIAGNOSIS — J984 Other disorders of lung: Secondary | ICD-10-CM

## 2019-12-13 DIAGNOSIS — R413 Other amnesia: Secondary | ICD-10-CM

## 2019-12-13 DIAGNOSIS — E039 Hypothyroidism, unspecified: Secondary | ICD-10-CM

## 2019-12-13 DIAGNOSIS — K1321 Leukoplakia of oral mucosa, including tongue: Secondary | ICD-10-CM

## 2019-12-13 DIAGNOSIS — C61 Malignant neoplasm of prostate: Secondary | ICD-10-CM

## 2019-12-13 DIAGNOSIS — Z974 Presence of external hearing-aid: Secondary | ICD-10-CM

## 2019-12-13 DIAGNOSIS — E785 Hyperlipidemia, unspecified: Secondary | ICD-10-CM

## 2019-12-13 DIAGNOSIS — M1990 Unspecified osteoarthritis, unspecified site: Secondary | ICD-10-CM

## 2019-12-13 DIAGNOSIS — I739 Peripheral vascular disease, unspecified: Secondary | ICD-10-CM

## 2019-12-20 ENCOUNTER — Encounter: Admit: 2019-12-20 | Discharge: 2019-12-20 | Payer: MEDICARE

## 2020-01-13 ENCOUNTER — Encounter: Admit: 2020-01-13 | Discharge: 2020-01-13 | Payer: MEDICARE

## 2020-01-13 ENCOUNTER — Ambulatory Visit: Admit: 2020-01-13 | Discharge: 2020-01-14 | Payer: MEDICARE

## 2020-01-13 ENCOUNTER — Ambulatory Visit: Admit: 2020-01-13 | Discharge: 2020-01-13 | Payer: MEDICARE

## 2020-01-13 DIAGNOSIS — L859 Epidermal thickening, unspecified: Secondary | ICD-10-CM

## 2020-01-13 DIAGNOSIS — M1990 Unspecified osteoarthritis, unspecified site: Secondary | ICD-10-CM

## 2020-01-13 DIAGNOSIS — I739 Peripheral vascular disease, unspecified: Secondary | ICD-10-CM

## 2020-01-13 DIAGNOSIS — I6523 Occlusion and stenosis of bilateral carotid arteries: Secondary | ICD-10-CM

## 2020-01-13 DIAGNOSIS — R4189 Other symptoms and signs involving cognitive functions and awareness: Secondary | ICD-10-CM

## 2020-01-13 DIAGNOSIS — E785 Hyperlipidemia, unspecified: Secondary | ICD-10-CM

## 2020-01-13 DIAGNOSIS — I70223 Atherosclerosis of native arteries of extremities with rest pain, bilateral legs: Secondary | ICD-10-CM

## 2020-01-13 DIAGNOSIS — C61 Malignant neoplasm of prostate: Secondary | ICD-10-CM

## 2020-01-13 DIAGNOSIS — I219 Acute myocardial infarction, unspecified: Secondary | ICD-10-CM

## 2020-01-13 DIAGNOSIS — Z9889 Other specified postprocedural states: Secondary | ICD-10-CM

## 2020-01-13 DIAGNOSIS — Z136 Encounter for screening for cardiovascular disorders: Secondary | ICD-10-CM

## 2020-01-13 DIAGNOSIS — R413 Other amnesia: Secondary | ICD-10-CM

## 2020-01-13 DIAGNOSIS — I2510 Atherosclerotic heart disease of native coronary artery without angina pectoris: Secondary | ICD-10-CM

## 2020-01-13 DIAGNOSIS — I10 Essential (primary) hypertension: Secondary | ICD-10-CM

## 2020-01-13 DIAGNOSIS — Z8249 Family history of ischemic heart disease and other diseases of the circulatory system: Secondary | ICD-10-CM

## 2020-01-13 DIAGNOSIS — J449 Chronic obstructive pulmonary disease, unspecified: Secondary | ICD-10-CM

## 2020-01-13 DIAGNOSIS — K1321 Leukoplakia of oral mucosa, including tongue: Secondary | ICD-10-CM

## 2020-01-13 DIAGNOSIS — Z974 Presence of external hearing-aid: Secondary | ICD-10-CM

## 2020-01-13 DIAGNOSIS — E039 Hypothyroidism, unspecified: Secondary | ICD-10-CM

## 2020-01-13 DIAGNOSIS — E782 Mixed hyperlipidemia: Secondary | ICD-10-CM

## 2020-01-13 DIAGNOSIS — J984 Other disorders of lung: Secondary | ICD-10-CM

## 2020-01-13 NOTE — Progress Notes
Date of Service: 01/13/2020              Chief Complaint   Patient presents with   ? Carotid Disease   ? Peripheral Artery Disease       History of Present Illness    This is a very sweet 85 year old gentleman with a history of hypertension, hyperlipidemia, carotid stenosis and peripheral vascular disease.  He returns today for bilateral lower extremity ABIs and a carotid surveillance ultrasound.    He has bilateral less than 50% carotid stenosis with a ratio on the right of 0.75 and a ratio on the left of 0.63.  No TIA or strokelike symptoms.    His ABIs today are 0.52 on the right with a TBI of 0.34 and an ABI on the left of 1.11 with a TBI of 1.03.  These are unchanged from previous.  He denies symptoms of rest pain, foot wounds and denies symptoms of claudication.    On January 31 of 2019 he had a left femoral to below-knee popliteal artery bypass using in situ greater saphenous vein graft and a left femoral and profunda femoral endarterectomy with bovine patch angioplasty.    He does have a history of a silent heart attack.  He has never had coronary angioplasty, stenting or bypass grafting.  He has never had a stroke.    He is not diabetic.  He does not smoke.  He does take aspirin and Pletal along with Zetia  daily.  His primary care physician suggested he may also benefit from Plavix..           Medical History:   Diagnosis Date   ? CAD (coronary artery disease) 04/10/2009   ? COPD (chronic obstructive pulmonary disease) (HCC)    ? Disorganized thinking    ? Dyslipidemia    ? FHx: abdominal aortic aneurysm 04/10/2009   ? HTN (hypertension) 04/10/2009   ? Hyperkeratosis    ? Hypothyroidism 04/10/2009   ? Leukoplakia of oral cavity    ? Lung disease 12/08/2018   ? Memory loss    ? Myocardial infarction (HCC) 11/17/2019   ? Osteoarthritis    ? Peripheral artery disease (HCC)    ? Prostate cancer (HCC) 04/10/2009   ? Wears hearing aid in both ears        Surgical History:   Procedure Laterality Date   ? KNEE REPLACEMENT Left 2000   ? PROSTATECTOMY  08/02/1999    w/ bilateral pelvic lymph nodes   ? INGUINAL HERNIA REPAIR Left 2010    x2   ? COLONOSCOPY  10/02/2010   ? CATARACT REMOVAL Left 12/2012   ? CHOLECYSTECTOMY  2017   ? CATHETER PLACEMENT ARTERIAL - ABDOMINAL/ PELVIC/ LOWER EXTREMITIY ARTERY - THIRD OR MORE BRANCH N/A 12/02/2017    Performed by Harley Alto, MD at [facility] CATH LAB   ? LEFT FEMORAL TO POPLITEAL BYPASS Left 12/17/2017    Performed by Carlena Sax, DO at [facility] OR   ? LEFT FEMORAL ENDARTERECTOMY Left 12/17/2017    Performed by Carlena Sax, DO at [facility] OR       Allergies:  No Known Allergies    Medication List:  ? albuterol-ipratropium (DUO-NEB) 0.5 mg-3 mg(2.5 mg base)/3 mL nebulizer solution Inhale 3 mL solution by nebulizer as directed three times daily.   ? aspirin EC 81 mg tablet Take 81 mg by mouth daily. Take with food.   ? atorvastatin (LIPITOR) 40 mg tablet Take 40 mg by mouth at bedtime  daily.   ? baclofen (LIORESAL) 5 mg tablet Take 5 mg by mouth as Needed (Leg cramps).   ? calcium carbonate (CALCIUM 600 PO) Take 1 capsule by mouth twice daily.   ? cholecalciferol (VITAMIN D-3) 1,000 units tablet Take 1,000 Units by mouth twice daily.   ? cilostazoL (PLETAL) 100 mg tablet Take 100 mg by mouth twice daily. Take on an empty stomach at least 30 minutes before or 2 hours after food.   ? Cinnamon Bark (CINNAMON) 500 mg cap Take 1 capsule by mouth twice daily.   ? clopiDOGrel (PLAVIX) 75 mg tablet Take 75 mg by mouth daily.   ? cyanocobalamin (VITAMIN B-12, RUBRAMIN) 1,000 mcg/mL injection Inject 1 mL into the muscle every 7 days.   ? diclofenac sodium (PENNSAID TP) Apply 40 mg topically to affected area twice daily.   ? donepeziL (ARICEPT) 10 mg tablet Take one tablet by mouth daily.   ? ezetimibe (ZETIA) 10 mg tablet Take 1 tablet by mouth once daily   ? homeopathic drugs (ALLERGY CM PO) Take 10 mg by mouth as Needed.   ? isosorbide mononitrate ER (IMDUR) 30 mg tablet Take 30 mg by mouth every morning.   ? Levothyroxine 125 mcg Cap Take 1 Tab by mouth Daily.   ? Magnesium 250 mg tab Take 1 tablet by mouth at bedtime daily.   ? meloxicam (MOBIC) 15 mg tablet Take 15 mg by mouth daily.   ? nitroglycerin (NITROSTAT) 0.4 mg tablet Place 0.4 mg under tongue every 5 minutes as needed for Chest Pain. Max of 3 tablets, call 911.   ? Omega-3-DHA-EPA-Fish Oil (FISH OIL) 1,000 mg (120 mg-180 mg) cap Take 1 capsule by mouth daily.   ? other medication Take  by mouth. Teavigo 150 mg- 1 tab by mouth in the AM, 2 tab at [facility] and 1 tab in the PM   ? pirfenidone (ESBRIET) 267 mg capsule Take 534 mg by mouth three times daily. Take 1 cap by mouth 3 times/day for 1 week, take 2 caps by mouth 3 times/day for 1 week, then take 3 caps 3 times/day. Take with meals.   ? predniSONE (DELTASONE) 5 mg tablet Take 5 mg by mouth daily with breakfast.   ? tiotropium-olodateroL (STIOLTO RESPIMAT) 2.5-2.5 mcg/actuation inhaler Inhale 2 puffs by mouth into the lungs daily.   ? VENTOLIN HFA 90 mcg/actuation inhaler Inhale 2 puffs by mouth into the lungs as Needed for Wheezing or Shortness of Breath. Shake well before use.       Social History:   reports that he quit smoking about 23 years ago. His smoking use included cigarettes. He has quit using smokeless tobacco. He reports previous alcohol use. He reports that he does not use drugs.    Family History   Problem Relation Age of Onset   ? Sudden Cardiac Death Mother    ? Cancer-Breast Mother    ? Hypertension Mother    ? Hypertension Father    ? Dementia Father    ? Cancer-Breast Sister    ? Cancer Brother         throat   ? Liver Cancer Brother    ? Dementia Brother        Review of Systems   Constitutional: Negative for activity change, appetite change, chills, diaphoresis, fatigue, fever and unexpected weight change.   HENT: Positive for congestion, hearing loss, rhinorrhea and sneezing. Negative for nosebleeds and tinnitus.    Eyes: Positive for discharge. Negative for  photophobia, itching and visual disturbance.   Respiratory: Positive for cough and shortness of breath. Negative for apnea and chest tightness.    Cardiovascular: Positive for leg swelling. Negative for chest pain and palpitations.   Gastrointestinal: Negative for abdominal pain, blood in stool, constipation, diarrhea, nausea and vomiting.   Endocrine: Negative for cold intolerance and heat intolerance.   Genitourinary: Negative for dysuria, frequency and hematuria.   Musculoskeletal: Positive for arthralgias, back pain, gait problem, neck pain and neck stiffness. Negative for joint swelling and myalgias.   Skin: Negative for color change, rash and wound.   Allergic/Immunologic: Negative for environmental allergies, food allergies and immunocompromised state.   Neurological: Positive for weakness. Negative for dizziness, tremors, seizures, syncope, facial asymmetry, speech difficulty, light-headedness and headaches.   Hematological: Negative for adenopathy. Bruises/bleeds easily.   Psychiatric/Behavioral: Positive for agitation and confusion. Negative for behavioral problems and dysphoric mood. The patient is not nervous/anxious.    All other systems reviewed and are negative.              Vitals:    01/13/20 1411 01/13/20 1412   BP: 122/72 123/76   BP Source: Arm, Right Upper    Patient Position: Sitting    Pulse: 105 101   Weight: 86.2 kg (190 lb)    Height: 172.7 cm (68)      Body mass index is 28.89 kg/m?Marland Kitchen     Right Brachial Systolic: 112   Left Brachial Systolic: 113   Right ABI: 0.52  Left ABI: 1.11    Physical Exam  Vitals signs and nursing note reviewed. Exam conducted with a chaperone present.   Constitutional:       General: He is not in acute distress.     Appearance: Normal appearance. He is well-groomed. He is not diaphoretic.   HENT:      Head: Normocephalic.   Eyes:      Conjunctiva/sclera:      Right eye: Right conjunctiva is not injected.      Left eye: Left conjunctiva is not injected.   Neck:      Musculoskeletal: Neck supple.      Vascular: No carotid bruit.      Trachea: Phonation normal.   Cardiovascular:      Rate and Rhythm: Regular rhythm. Tachycardia present.      Pulses:           Carotid pulses are 2+ on the right side and 2+ on the left side.       Radial pulses are 2+ on the right side and 2+ on the left side.        Dorsalis pedis pulses are detected w/ Doppler on the right side and detected w/ Doppler on the left side.        Posterior tibial pulses are detected w/ Doppler on the right side and detected w/ Doppler on the left side.      Heart sounds: Normal heart sounds.   Pulmonary:      Effort: Pulmonary effort is normal. No respiratory distress.      Breath sounds: Examination of the right-lower field reveals decreased breath sounds and rales. Examination of the left-lower field reveals rales. Decreased breath sounds and rales present.      Comments: He uses 2 L of supplemental oxygen continuously.  Abdominal:      Tenderness: There is no abdominal tenderness.   Feet:      Right foot:      Skin integrity: Skin  integrity normal. No ulcer, blister, skin breakdown or erythema.      Left foot:      Skin integrity: Skin integrity normal. No ulcer, blister, skin breakdown or erythema.   Skin:     General: Skin is warm and dry.      Capillary Refill: Capillary refill takes less than 2 seconds.      Coloration: Skin is not cyanotic or mottled.   Neurological:      General: No focal deficit present.      Mental Status: He is alert.      Motor: Motor function is intact.      Coordination: Coordination is intact.      Gait: Gait is intact.      Comments: His grip strength is strong and equal bilaterally.   Psychiatric:         Attention and Perception: Attention and perception normal.         Mood and Affect: Mood and affect normal.         Speech: Speech normal.         Behavior: Behavior normal. Behavior is cooperative.         Thought Content: Thought content normal.             Assessment and Plan:    1. PAD (peripheral artery disease) (HCC)  [place] NONINVASIVE LOWER EXT PVR    [place] NONINVASIVE LOWER EXT PVR    US DOPPLER ART LE LEFT   2. History of peripheral artery bypass  US DOPPLER ART LE LEFT   3. Bilateral carotid artery stenosis   US DUPLEX SCAN CAROTID BILATERAL   4. Essential hypertension     5. Mixed hyperlipidemia       He has stable ABIs with no symptoms of limb threatening ischemia or right leg rest pain.  He has bilateral less than 50% asymptomatic carotid stenosis.  I discussed with him his results and recommendations for follow-up.  We discussed the differences between Plavix and Pletal.  We discussed that if tolerated, and beneficial, he could be on both Plavix and Pletal along with aspirin daily.  He had a trial off of Pletal and felt like his legs were worse and so resumed the Pletal and stopped the Plavix.    Continue aspirin and Pletal daily.  May resume Plavix as indicated.    Follow-up with a carotid surveillance ultrasound in 1 year.    He is actually due for a left leg bypass graft surveillance ultrasound with ABIs in 6 months.    We did give him information on TIA and stroke and peripheral arterial disease for his visit summary.    Kathaleen Maser, APRN-NP

## 2020-05-22 ENCOUNTER — Encounter: Admit: 2020-05-22 | Discharge: 2020-05-22 | Payer: MEDICARE

## 2020-05-22 DIAGNOSIS — I10 Essential (primary) hypertension: Secondary | ICD-10-CM

## 2020-05-22 DIAGNOSIS — I739 Peripheral vascular disease, unspecified: Secondary | ICD-10-CM

## 2020-05-22 DIAGNOSIS — I2510 Atherosclerotic heart disease of native coronary artery without angina pectoris: Secondary | ICD-10-CM

## 2020-05-22 NOTE — Patient Instructions
Stop Imdur  Follow up as directed.  Call sooner if issues.  Call the [facility] nursing line at [phone].  Leave a detailed message for the nurse in [place] with how we can assist you and we will call you back.

## 2020-05-22 NOTE — Progress Notes
Date of Service: 05/22/2020    Aaron Fuentes Aaron Fuentes is a 85 y.o. male.       Chief Complaint: Follow-up, coronary artery disease    History of Present Illness:     I had the pleasure of seeing Aaron Fuentes in our [place] office this afternoon for cardiovascular followup.     As you know, Aaron Fuentes is a remarkably pleasant 85 year old gentleman with history of Alzheimer dementia, coronary artery disease, hypertension, dyslipidemia, bilateral carotid artery disease, and peripheral arterial disease, s/p left femoral popliteal bypass and left femoral/profunda femoral endarterectomy with bovine patch angioplasty in January 2019.  I first met Mr. Flitton in December of 2020 when he was in the emergency department here at [facility].  He had experienced a single episode of chest pain at home.  In the emergency department, his ECG did not demonstrate any dynamic ST-segment changes, but he did have an elevated troponin, which peaked at 1.216.  After extensive discussion with him and his family, we decided to treat his non-ST-elevation acute coronary syndrome medically.  He was loaded with aspirin and Plavix and hospitalized for 48 hours of IV heparin.  Since being home, he has really done quite well.     Since our last visit, Aaron Fuentes has not experienced any further episodes of chest discomfort.  He continues to do physical therapy 3 times a week up here at the hospital.  His shortness of breath remains stable as does his baseline requirement for oxygen.  He is not experiencing any lightheadedness, dizziness, palpitations, orthopnea, paroxysmal nocturnal dyspnea.     Aaron Fuentes's wife did ask me today about whether or not we should continue his Imdur as she was unsure what this medication was for.     Most recent cardiovascular testing includes a transthoracic echocardiogram from November 21, 2019.  This demonstrated normal left ventricular systolic function.  EF 59%.  No segmental wall motion abnormalities.  Normal right ventricular size and systolic function.  No significant valvular disease.  Pulmonary artery pressure could not be determined due to inadequate tricuspid regurgitation signal.  Most recent stress test is from June 26, 2016.  This demonstrated a small-sized, inferolateral perfusion defect.  This is consistent with a limited area of myocardial ischemia.  Overall, a low-risk study.         Past Medical History:  Patient Active Problem List    Diagnosis Date Noted   ? Generalized body aches 06/28/2019     Possibly statin related.  Atorvastatin held 06/29/2019.     ? Major neurocognitive disorder due to Alzheimer's disease, probable, without behavioral disturbance (HCC) 12/02/2018     Initial visit with Dr. Elon Jester:   Syndrome of Cognition  Dementia Syndrome Present: Yes   Dementia Stage (if Present): mild  Abnormal signs: Memory dysfunction and Executive dysfunction  Localization: Bihemispheral, cortices and Mesial temporal structures  Diagnosis: Alzheimer's disease  Differential diagnosis: Inflammatory disorders, Vascular disorders, Metabolic disorders and Other degenerative causes    CARE PLAN  Diagnostic tests: Recheck B12 level  Treatment: Other discussed interventions Increase donepezil to 10 mg. Limit etoh to 1 beer/day. Exercise.       ? Left leg claudication (HCC) 12/17/2017   ? PVD (peripheral vascular disease) with claudication (HCC) 12/02/2017   ? Falls 09/30/2017   ? Bilateral carotid artery disease (HCC) 01/22/2016     03/22/25 Carotid ultrasound  [facility]: mild disease, uncharged from prior     ? Extremity atherosclerosis with intermittent claudication (HCC) 02/25/2011  11/21/10 Bilat leg [place] [facility]: Right: Minimal reduction distal flow. Occluded L SFA w/ popliteal reconstitution. Occluded distal anterior Tib and DP arteries. Pletal tried, Dr Yetta Flock w/ improvement.   Feb 2019 redo revascularization Dr. Carlena Sax, [facility] after Dr. Harley Alto performed angiography and did not think lesion suitable for PCI stenting.     ? Coronary artery disease due to lipid rich plaque 04/10/2009     a.  7/96 Dobut echo EF 60%, Poss inf hypo at rest, normal w. dobutamine all regions  b. 12/00-Bruce thallium: c/w limited inferior MI, EF 53%, lung/heart count 0.52, slightly high  c. 2001Cath. in [place] - mild single vessel disease, probably RCA,  d. 3/02, Bruce stress echo, 8 min, 85% HR, 55% EF, No ischemia     ? HTN (hypertension) 04/10/2009   ? Hyperlipidemia 04/10/2009     a. 9/95 total 225 trig 129 HDL 27    LDL 172 baseline  b. 12/00 total 169 trig 189 HDL 34  LDL 97 Lipitor 10  c. 08/04/07 total 143 trig 80 HDL 36 LDLd  93 Lipitor 10 Niaspan1000  d. 12/21/07 total 136 trig 77 HDL 49, LDL 72 lipitor 10 niaspan 1610  E. 01/24/09 Total 144 trig 80 HDL 42 LDL 80 Lipitor 10, Niaspan 2 gm, increase lipitor to 40      ? Hypothyroidism 04/10/2009     a. 12/00 TSH elevated 5.9, then 2/01 17.26 w/ free T4 borderline low 0.79     ? Osteoarthritis 04/10/2009     a. 1999-Left knee replacement     ? Prostate cancer (HCC) 04/10/2009     a. 9/00-prostatectomy  B. 2013: No recurrence, Dr. Larwance Rote.      ? FHx: abdominal aortic aneurysm 04/10/2009      a. 08/02/07 Max diameter AA 2.0, normal         Review of Systems   Constitution: Positive for malaise/fatigue.   HENT: Positive for congestion, nosebleeds and stridor.    Eyes: Negative.    Cardiovascular: Positive for dyspnea on exertion.   Respiratory: Positive for cough, shortness of breath and wheezing.    Endocrine: Negative.    Hematologic/Lymphatic: Bruises/bleeds easily.   Skin: Negative.    Musculoskeletal: Positive for falls and muscle weakness.   Gastrointestinal: Negative.    Genitourinary: Negative.    Neurological: Positive for weakness.   Psychiatric/Behavioral: Negative.    Allergic/Immunologic: Negative.        Vitals:    05/22/20 1257 05/22/20 1314   BP: 102/62 102/64   BP Source: Arm, Left Upper Arm, Right Upper   Patient Position: Sitting Sitting   Pulse: 100 SpO2: 97%    Weight: 82.4 kg (181 lb 9.6 oz)    Height: 1.676 m (5' 6)    PainSc: Zero      Body mass index is 29.31 kg/m?Marland Kitchen    Physical Examination:  General Appearance: No acute distress. Fully alert and oriented.  Skin: Warm. No ulcers or xanthomas.   HEENT: Grossly unremarkable. Lips and oral mucosa without pallor or cyanosis. Moist mucous membranes.   Neck Veins: Normal jugular venous pressure. Neck veins are not distended.  Carotid Arteries: Normal carotid upstroke bilaterally. No bruits.  Chest Inspection: Chest is normal in appearance.  Auscultation/Percussion: Normal respiratory effort. Lungs clear to auscultation bilaterally. No wheezes, rales, or rhonchi.    Cardiac Rhythm: Regular rhythm. Normal rate.  Cardiac Auscultation: Normal S1 & S2. No S3 or S4. No rub.  Murmurs: No cardiac murmurs.  Peripheral Circulation: Normal peripheral circulation.   Abdominal Aorta: No abdominal aortic bruit.  Extremities: Appropriately warm to touch. No lower extremity edema.  Abdominal Exam: Soft, non-tender. No masses, no organomegaly. Normal bowel sounds.  Neurologic Exam: Neurological assessment grossly intact.       Assessment and Plan:  1. Coronary artery disease:  Aaron Fuentes was found to have non ST elevation acute coronary syndrome back in December of 2020.  At that time, he presented to hospital with chest pain and was found to have elevated troponin in the emergency department.  We treated medically.  He had 48 hours of IV heparin.  He was loaded with aspirin and clopidogrel.  He has done well since.  No further episodes of chest pain since I last saw him several months ago.  He is currently on Imdur 30 mg daily, but due to his lack of symptoms and his lower blood pressure, we are going to stop that today.  I did tell him and his wife that should he experience chest discomfort after stopping the Imdur, then we may need to consider restarting it in the future.  I do not think he is a great candidate for beta-blocker therapy given his significant lung disease.  We will continue high-intensity statin for now.  He seems to be tolerating it well.    2. Hypertension:  Well controlled.  It is actually a little on the low side today.  102/62 mm Hg.  We are going to stop his Imdur today.    3. Dyslipidemia:  His cholesterol is followed through Dr. Gilles Chiquito office.  Goal LDL is less than 70.    4. Peripheral arterial disease:  Prior interventions.  He had femoral-popliteal bypass and left femoral/profunda femoral endarterectomy with bovine patch, angioplasty in January 2019.  Continue aggressive medical therapy with aspirin and high-intensity statin.    5. Alzheimer dementia:  Overall stable according to his wife.  He is currently on donepezil.     I really enjoyed seeing Aaron Fuentes back in the office today and I appreciate the opportunity to take part in his care.  I will plan to see him back in about 6 months.  If I can be of any assistance in the interim, please do not hesitate to reach out with questions or concerns.           Total time spent on today's office visit was 35 minutes. This includes face-to-face in person visit with patient as well as non face-to-face time including review of the electronic medical record, outside records, labs, radiologic studies, cardiovascular studies, formulation of treatment plan, after visit summary, future disposition, personal discussions, and documentation.    Current Medications (including today's revisions)  ? albuterol-ipratropium (DUO-NEB) 0.5 mg-3 mg(2.5 mg base)/3 mL nebulizer solution Inhale 3 mL solution by nebulizer as directed three times daily.   ? aspirin EC 81 mg tablet Take 81 mg by mouth daily. Take with food.   ? atorvastatin (LIPITOR) 20 mg tablet Take 20 mg by mouth daily.   ? baclofen (LIORESAL) 5 mg tablet Take 5 mg by mouth as Needed (Leg cramps).   ? calcium carbonate (CALCIUM 600 PO) Take 1 capsule by mouth twice daily.   ? cetirizine (ZYRTEC) 10 mg tablet Take 10 mg by mouth every morning.   ? cholecalciferol (VITAMIN D-3) 1,000 units tablet Take 1,000 Units by mouth twice daily.   ? cilostazoL (PLETAL) 100 mg tablet Take 100 mg by mouth twice daily. Take on an  empty stomach at least 30 minutes before or 2 hours after food.   ? Cinnamon Bark (CINNAMON) 500 mg cap Take 1 capsule by mouth twice daily.   ? cyanocobalamin (VITAMIN B-12, RUBRAMIN) 1,000 mcg/mL injection Inject 1 mL into the muscle every 7 days.   ? diclofenac sodium (PENNSAID TP) Apply 40 mg topically to affected area twice daily.   ? docusate (COLACE) 100 mg capsule Take 100 mg by mouth daily.   ? donepeziL (ARICEPT) 10 mg tablet Take one tablet by mouth daily.   ? ezetimibe (ZETIA) 10 mg tablet Take 1 tablet by mouth once daily   ? guaiFENesin LA (MUCINEX) 600 mg tablet Take 600 mg by mouth twice daily.   ? ipratropium bromide (ATROVENT) 42 mcg (0.06 %) nasal spray Apply 1-2 sprays to each nostril as directed as Needed.   ? isosorbide mononitrate ER (IMDUR) 30 mg tablet Take 30 mg by mouth every morning.   ? Levothyroxine 125 mcg Cap Take 1 Tab by mouth Daily.   ? meloxicam (MOBIC) 15 mg tablet Take 15 mg by mouth daily.   ? mupirocin calcium (BACTROBAN NASAL) 2 % nasal ointment Apply 0.5 g to each nostril as directed daily. Use half of each tube in one nostril and half in the other nostril.   ? nitroglycerin (NITROSTAT) 0.4 mg tablet Place 0.4 mg under tongue every 5 minutes as needed for Chest Pain. Max of 3 tablets, call 911.   ? Omega-3-DHA-EPA-Fish Oil (FISH OIL) 1,000 mg (120 mg-180 mg) cap Take 1 capsule by mouth daily.   ? other medication Take  by mouth. Teavigo 150 mg- 1 tab by mouth in the AM, 2 tab at [facility] and 1 tab in the PM   ? pirfenidone (ESBRIET) 267 mg capsule Take 534 mg by mouth three times daily. Take 1 cap by mouth 3 times/day for 1 week, take 2 caps by mouth 3 times/day for 1 week, then take 3 caps 3 times/day. Take with meals.   ? predniSONE (DELTASONE) 5 mg tablet Take 5 mg by mouth daily with breakfast.   ? tiotropium-olodateroL (STIOLTO RESPIMAT) 2.5-2.5 mcg/actuation inhaler Inhale 2 puffs by mouth into the lungs daily.   ? VENTOLIN HFA 90 mcg/actuation inhaler Inhale 2 puffs by mouth into the lungs as Needed for Wheezing or Shortness of Breath. Shake well before use.

## 2020-07-12 ENCOUNTER — Encounter: Admit: 2020-07-12 | Discharge: 2020-07-12 | Payer: MEDICARE

## 2020-07-12 MED ORDER — EZETIMIBE 10 MG PO TAB
ORAL_TABLET | Freq: Every day | 3 refills | Status: AC
Start: 2020-07-12 — End: ?

## 2020-07-13 ENCOUNTER — Ambulatory Visit: Admit: 2020-07-13 | Discharge: 2020-07-13 | Payer: MEDICARE

## 2020-07-13 ENCOUNTER — Encounter: Admit: 2020-07-13 | Discharge: 2020-07-13 | Payer: MEDICARE

## 2020-07-13 DIAGNOSIS — E782 Mixed hyperlipidemia: Secondary | ICD-10-CM

## 2020-07-13 DIAGNOSIS — Z8249 Family history of ischemic heart disease and other diseases of the circulatory system: Secondary | ICD-10-CM

## 2020-07-13 DIAGNOSIS — Z974 Presence of external hearing-aid: Secondary | ICD-10-CM

## 2020-07-13 DIAGNOSIS — J984 Other disorders of lung: Secondary | ICD-10-CM

## 2020-07-13 DIAGNOSIS — K1321 Leukoplakia of oral mucosa, including tongue: Secondary | ICD-10-CM

## 2020-07-13 DIAGNOSIS — I10 Essential (primary) hypertension: Secondary | ICD-10-CM

## 2020-07-13 DIAGNOSIS — M1990 Unspecified osteoarthritis, unspecified site: Secondary | ICD-10-CM

## 2020-07-13 DIAGNOSIS — Z9889 Other specified postprocedural states: Secondary | ICD-10-CM

## 2020-07-13 DIAGNOSIS — C61 Malignant neoplasm of prostate: Secondary | ICD-10-CM

## 2020-07-13 DIAGNOSIS — I739 Peripheral vascular disease, unspecified: Secondary | ICD-10-CM

## 2020-07-13 DIAGNOSIS — I219 Acute myocardial infarction, unspecified: Secondary | ICD-10-CM

## 2020-07-13 DIAGNOSIS — I2510 Atherosclerotic heart disease of native coronary artery without angina pectoris: Secondary | ICD-10-CM

## 2020-07-13 DIAGNOSIS — J449 Chronic obstructive pulmonary disease, unspecified: Secondary | ICD-10-CM

## 2020-07-13 DIAGNOSIS — E785 Hyperlipidemia, unspecified: Secondary | ICD-10-CM

## 2020-07-13 DIAGNOSIS — Z9981 Dependence on supplemental oxygen: Secondary | ICD-10-CM

## 2020-07-13 DIAGNOSIS — E7800 Pure hypercholesterolemia, unspecified: Secondary | ICD-10-CM

## 2020-07-13 DIAGNOSIS — R413 Other amnesia: Secondary | ICD-10-CM

## 2020-07-13 DIAGNOSIS — L84 Corns and callosities: Secondary | ICD-10-CM

## 2020-07-13 DIAGNOSIS — E039 Hypothyroidism, unspecified: Secondary | ICD-10-CM

## 2020-07-13 DIAGNOSIS — R4189 Other symptoms and signs involving cognitive functions and awareness: Secondary | ICD-10-CM

## 2020-07-13 DIAGNOSIS — F17210 Nicotine dependence, cigarettes, uncomplicated: Secondary | ICD-10-CM

## 2020-07-13 DIAGNOSIS — Z87891 Personal history of nicotine dependence: Secondary | ICD-10-CM

## 2020-07-13 DIAGNOSIS — L859 Epidermal thickening, unspecified: Secondary | ICD-10-CM

## 2020-07-13 NOTE — Progress Notes
Date of Service: 07/13/2020              Chief Complaint   Patient presents with   ? Follow Up     45mo ABI       History of Present Illness    This is a very sweet 85 year old gentleman with a history of hypertension, hyperlipidemia, carotid stenosis and peripheral vascular disease.  He returns today for a left leg bypass graft surveillance ultrasound with ABIs.    His left leg bypass graft is patent with no areas of velocity elevation to suggest a significant stenosis.  His ABIs on the right today are 0.54 with a TBI of 0.49 and an ABI on the left of 1.17 with a TBI of 1.14.  His February ABIs indicated an index on the right of 0.52 and an index on the left of 1.10.  He is on 2 L of supplemental oxygen continuously.  He does not walk far enough to claudicate and actually he has been walking a little bit better and his right leg discomfort has eased up quite a bit according to his wife.  They complain of a callus on his posterior right heel.  He denies any chest pain.  He denies shortness of breath as long as he is not wearing his oxygen and not being active.  He denies TIA and strokelike symptoms.    On January 31 of 2019 he had a left femoral to below-knee popliteal artery bypass using in situ greater saphenous vein graft and a left femoral and profunda femoral endarterectomy with bovine patch angioplasty.    He does have a history of a silent heart attack.  He has never had coronary angioplasty, stenting or bypass grafting.  He has never had a stroke.    He is not diabetic.  He does not smoke.  He does take aspirin and Pletal along with Zetia  daily.             Medical History:   Diagnosis Date   ? CAD (coronary artery disease) 04/10/2009   ? Cigarette smoker    ? COPD (chronic obstructive pulmonary disease) (HCC)    ? Disorganized thinking    ? Dyslipidemia    ? FHx: abdominal aortic aneurysm 04/10/2009   ? Former cigarette smoker    ? High cholesterol    ? HTN (hypertension) 04/10/2009   ? Hyperkeratosis    ? Hypothyroidism 04/10/2009   ? Leukoplakia of oral cavity    ? Lung disease 12/08/2018   ? Memory loss    ? Myocardial infarction (HCC) 11/17/2019   ? Osteoarthritis    ? Peripheral artery disease (HCC)    ? Prostate cancer (HCC) 04/10/2009   ? Wears hearing aid in both ears        Surgical History:   Procedure Laterality Date   ? KNEE REPLACEMENT Left 2000   ? PROSTATECTOMY  08/02/1999    w/ bilateral pelvic lymph nodes   ? INGUINAL HERNIA REPAIR Left 2010    x2   ? COLONOSCOPY  10/02/2010   ? CATARACT REMOVAL Left 12/2012   ? CHOLECYSTECTOMY  2017   ? CATHETER PLACEMENT ARTERIAL - ABDOMINAL/ PELVIC/ LOWER EXTREMITIY ARTERY - THIRD OR MORE BRANCH N/A 12/02/2017    Performed by Harley Alto, MD at [facility] CATH LAB   ? LEFT FEMORAL TO POPLITEAL BYPASS Left 12/17/2017    Performed by Carlena Sax, DO at [facility] OR   ? LEFT FEMORAL ENDARTERECTOMY Left 12/17/2017  Performed by Carlena Sax, DO at [facility] OR       Allergies:  Allergies   Allergen Reactions   ? Montelukast HEADACHE     Sleepiness, headaches         Medication List:  ? albuterol-ipratropium (DUO-NEB) 0.5 mg-3 mg(2.5 mg base)/3 mL nebulizer solution Inhale 3 mL solution by nebulizer as directed three times daily.   ? aspirin EC 81 mg tablet Take 81 mg by mouth daily. Take with food.   ? atorvastatin (LIPITOR) 20 mg tablet Take 20 mg by mouth daily.   ? baclofen (LIORESAL) 5 mg tablet Take 5 mg by mouth as Needed (Leg cramps).   ? calcium carbonate (CALCIUM 600 PO) Take 1 capsule by mouth twice daily.   ? cetirizine (ZYRTEC) 10 mg tablet Take 10 mg by mouth every morning.   ? cholecalciferol (VITAMIN D-3) 1,000 units tablet Take 1,000 Units by mouth twice daily.   ? cilostazoL (PLETAL) 100 mg tablet Take 100 mg by mouth twice daily. Take on an empty stomach at least 30 minutes before or 2 hours after food.   ? Cinnamon Bark (CINNAMON) 500 mg cap Take 1 capsule by mouth twice daily.   ? cyanocobalamin (VITAMIN B-12, RUBRAMIN) 1,000 mcg/mL injection Inject 1 mL into the muscle every 7 days.   ? diclofenac sodium (PENNSAID TP) Apply 40 mg topically to affected area twice daily.   ? donepeziL (ARICEPT) 10 mg tablet Take one tablet by mouth daily.   ? ezetimibe (ZETIA) 10 mg tablet Take 1 tablet by mouth once daily   ? guaiFENesin LA (MUCINEX) 600 mg tablet Take 600 mg by mouth twice daily.   ? ipratropium bromide (ATROVENT) 42 mcg (0.06 %) nasal spray Apply 1-2 sprays to each nostril as directed as Needed.   ? Levothyroxine 125 mcg Cap Take 1 Tab by mouth Daily.   ? meloxicam (MOBIC) 15 mg tablet Take 15 mg by mouth daily.   ? nitroglycerin (NITROSTAT) 0.4 mg tablet Place 0.4 mg under tongue every 5 minutes as needed for Chest Pain. Max of 3 tablets, call 911.   ? Omega-3-DHA-EPA-Fish Oil (FISH OIL) 1,000 mg (120 mg-180 mg) cap Take 1 capsule by mouth daily.   ? other medication Take  by mouth. Teavigo 150 mg- 1 tab by mouth in the AM, 2 tab at [facility] and 1 tab in the PM   ? pirfenidone (ESBRIET) 267 mg capsule Take 534 mg by mouth three times daily. Take 1 cap by mouth 3 times/day for 1 week, take 2 caps by mouth 3 times/day for 1 week, then take 3 caps 3 times/day. Take with meals.   ? predniSONE (DELTASONE) 5 mg tablet Take 5 mg by mouth daily with breakfast.   ? tiotropium-olodateroL (STIOLTO RESPIMAT) 2.5-2.5 mcg/actuation inhaler Inhale 2 puffs by mouth into the lungs daily.   ? VENTOLIN HFA 90 mcg/actuation inhaler Inhale 2 puffs by mouth into the lungs as Needed for Wheezing or Shortness of Breath. Shake well before use.       Social History:   reports that he quit smoking about 24 years ago. His smoking use included cigarettes. He has quit using smokeless tobacco. He reports previous alcohol use. He reports that he does not use drugs.    Family History   Problem Relation Age of Onset   ? Sudden Cardiac Death Mother    ? Cancer-Breast Mother    ? Hypertension Mother    ? Hypertension Father    ?  Dementia Father    ? Cancer-Breast Sister    ? Cancer Brother         throat   ? Liver Cancer Brother    ? Dementia Brother        Review of Systems   Constitutional: Negative for activity change, appetite change, chills, diaphoresis, fatigue, fever and unexpected weight change.   HENT: Positive for rhinorrhea and sneezing. Negative for hearing loss, nosebleeds and tinnitus.    Eyes: Negative for photophobia, itching and visual disturbance.   Respiratory: Positive for cough. Negative for apnea, chest tightness and shortness of breath.    Cardiovascular: Negative for chest pain, palpitations and leg swelling.   Gastrointestinal: Positive for blood in stool. Negative for abdominal pain, constipation, diarrhea, nausea and vomiting.   Endocrine: Negative for cold intolerance and heat intolerance.   Genitourinary: Negative for dysuria, frequency, hematuria and urgency.   Musculoskeletal: Positive for arthralgias. Negative for back pain, gait problem, joint swelling, myalgias, neck pain and neck stiffness.   Skin: Negative for color change, rash and wound.   Allergic/Immunologic: Positive for environmental allergies. Negative for food allergies and immunocompromised state.   Neurological: Negative for dizziness, tremors, seizures, syncope, facial asymmetry, speech difficulty, weakness, light-headedness, numbness and headaches.   Hematological: Negative for adenopathy. Does not bruise/bleed easily.   Psychiatric/Behavioral: Positive for confusion. Negative for behavioral problems and dysphoric mood. The patient is not nervous/anxious.                Vitals:    07/13/20 1304 07/13/20 1305   BP: 117/69 110/69   BP Source: Arm, Right Upper Arm, Left Upper   Patient Position: Sitting Sitting   Pulse: 86 89   Temp: 36.8 ?C (98.3 ?F)    TempSrc: Oral    Weight: 82.1 kg (181 lb)    Height: 167.6 cm (66)    PainSc: Zero      Body mass index is 29.21 kg/m?Marland Kitchen   Right Brachial Systolic: 106   Left Brachial Systolic: 111   Right ABI: 0.54  Left ABI: 1.17    Physical Exam  Vitals and nursing note reviewed. Exam conducted with a chaperone present.   Constitutional:       General: He is not in acute distress.     Appearance: Normal appearance. He is well-developed, well-groomed and normal weight. He is not ill-appearing or diaphoretic.   HENT:      Head: Normocephalic.   Eyes:      General:         Right eye: No discharge.         Left eye: No discharge.   Neck:      Vascular: No carotid bruit.      Trachea: Trachea and phonation normal. No tracheal deviation.   Cardiovascular:      Rate and Rhythm: Normal rate and regular rhythm.      Pulses:           Carotid pulses are 2+ on the right side and 2+ on the left side.       Radial pulses are 2+ on the right side and 2+ on the left side.        Femoral pulses are 2+ on the right side and 2+ on the left side.       Popliteal pulses are 2+ on the left side.        Dorsalis pedis pulses are detected w/ Doppler on the right side and 1+ on the left  side.        Posterior tibial pulses are detected w/ Doppler on the right side and detected w/ Doppler on the left side.      Heart sounds: Normal heart sounds.   Pulmonary:      Effort: Pulmonary effort is normal. No respiratory distress.      Breath sounds: Examination of the right-lower field reveals decreased breath sounds, wheezing and rales. Examination of the left-lower field reveals decreased breath sounds, wheezing and rales. Decreased breath sounds, wheezing and rales present.   Abdominal:      Palpations: Abdomen is soft.      Tenderness: There is no abdominal tenderness.   Musculoskeletal:         General: Normal range of motion.      Cervical back: Normal range of motion and neck supple.      Right lower leg: No edema.      Left lower leg: No edema.      Right foot: Normal range of motion. No deformity.      Left foot: Normal range of motion. No deformity.   Feet:      Right foot:      Skin integrity: Callus present. No ulcer, blister, skin breakdown or erythema.      Toenail Condition: Right toenails are normal.      Left foot: Skin integrity: Skin integrity normal. No ulcer, blister, skin breakdown or erythema.      Toenail Condition: Left toenails are normal.      Comments: He has a small white callus on his posterior right heel.  There is no surrounding redness, no breakdown and no necrosis.  Skin:     General: Skin is warm and dry.      Capillary Refill: Capillary refill takes less than 2 seconds.      Coloration: Skin is not cyanotic or mottled.   Neurological:      General: No focal deficit present.      Mental Status: He is alert.      Motor: Motor function is intact.      Coordination: Coordination is intact.      Gait: Gait is intact.      Comments: Grip strength is strong and equal bilaterally.   Psychiatric:         Attention and Perception: Attention normal.         Mood and Affect: Mood normal.         Speech: Speech normal.         Behavior: Behavior normal. Behavior is cooperative.         Thought Content: Thought content normal.             Assessment and Plan:    1. PVD (peripheral vascular disease) with claudication (HCC)  VAS US DOPPLER ART LE LEFT    VAS [place] NONINVASIVE LOWER EXT PVR   2. History of peripheral artery bypass     3. Essential hypertension     4. Mixed hyperlipidemia     5. Callus of heel     6. Supplemental oxygen dependent       He continues to have a left leg patent bypass graft without any areas of velocity elevation to suggest a significant stenosis.  His right leg ABI is a stable 0.54 in his left leg ABI is a normal 1.17.  He is having no symptoms of claudication and no signs or symptoms of limb threatening ischemia, specifically of the right  foot.  I discussed with him and his wife his results and recommendations for follow-up.  We discussed exquisite foot care, specifically on the right to prevent tissue breakdown, particularly in the area of the callus on his posterior foot.  We dressed the callus today with a dot of bacitracin ointment for the petroleum in the mixture and covered it with a Band-Aid.    He should continue his aspirin, Pletal and Zetia daily.  He has an upcoming EGD and colonoscopy because of recent diagnosis of anemia and occult blood in stool. We recommend stopping antiplatelet agents like aspirin and Plavix for as short a time as possible and resuming them as soon as possible to get safely through a recommended procedure. We do not stop these drugs for vascular surgery but it is a common recommendation from other doctors and surgeons.     Continue to apply an ointment and Band-Aid or padding to the right heel callus to avoid continued buildup and/or breakdown.    We will follow-up again in 1 year with a bypass graft surveillance ultrasound, ABIs and a bilateral surveillance carotid ultrasound with an office visit at that time.    We gave him information about a limb threatening problem on his visit summary.    Kathaleen Maser, APRN-NP

## 2020-08-01 ENCOUNTER — Encounter: Admit: 2020-08-01 | Discharge: 2020-08-01 | Payer: MEDICARE

## 2020-08-01 NOTE — Telephone Encounter
Patient was last seen by Angelique Blonder on 8/27. He was warned that if wounds start on his legs or foot, that it is a very serious thing, due to the limited blood supply in his right leg. He has developed wounds on two of his toes and also has a sore on his shin. He is also having severe pain with walking. His wife is calling asking for suggestions on what to do. She has pictures she can send, since they live an hour away in [place]. Please return call to wife, [name] [phone]

## 2020-08-01 NOTE — Telephone Encounter
Office visit scheduled for further evaluation.

## 2020-11-28 ENCOUNTER — Encounter: Admit: 2020-11-28 | Discharge: 2020-11-28 | Payer: MEDICARE

## 2020-11-29 ENCOUNTER — Encounter: Admit: 2020-11-29 | Discharge: 2020-11-29 | Payer: MEDICARE

## 2020-12-11 ENCOUNTER — Encounter: Admit: 2020-12-11 | Discharge: 2020-12-11 | Payer: MEDICARE

## 2020-12-11 DIAGNOSIS — I219 Acute myocardial infarction, unspecified: Secondary | ICD-10-CM

## 2020-12-11 DIAGNOSIS — Z8249 Family history of ischemic heart disease and other diseases of the circulatory system: Secondary | ICD-10-CM

## 2020-12-11 DIAGNOSIS — K1321 Leukoplakia of oral mucosa, including tongue: Secondary | ICD-10-CM

## 2020-12-11 DIAGNOSIS — I739 Peripheral vascular disease, unspecified: Secondary | ICD-10-CM

## 2020-12-11 DIAGNOSIS — E7800 Pure hypercholesterolemia, unspecified: Secondary | ICD-10-CM

## 2020-12-11 DIAGNOSIS — I2510 Atherosclerotic heart disease of native coronary artery without angina pectoris: Secondary | ICD-10-CM

## 2020-12-11 DIAGNOSIS — R4189 Other symptoms and signs involving cognitive functions and awareness: Secondary | ICD-10-CM

## 2020-12-11 DIAGNOSIS — C61 Malignant neoplasm of prostate: Secondary | ICD-10-CM

## 2020-12-11 DIAGNOSIS — J449 Chronic obstructive pulmonary disease, unspecified: Secondary | ICD-10-CM

## 2020-12-11 DIAGNOSIS — M1990 Unspecified osteoarthritis, unspecified site: Secondary | ICD-10-CM

## 2020-12-11 DIAGNOSIS — R413 Other amnesia: Secondary | ICD-10-CM

## 2020-12-11 DIAGNOSIS — J984 Other disorders of lung: Secondary | ICD-10-CM

## 2020-12-11 DIAGNOSIS — L859 Epidermal thickening, unspecified: Secondary | ICD-10-CM

## 2020-12-11 DIAGNOSIS — Z974 Presence of external hearing-aid: Secondary | ICD-10-CM

## 2020-12-11 DIAGNOSIS — I10 Essential (primary) hypertension: Secondary | ICD-10-CM

## 2020-12-11 DIAGNOSIS — Z87891 Personal history of nicotine dependence: Secondary | ICD-10-CM

## 2020-12-11 DIAGNOSIS — E785 Hyperlipidemia, unspecified: Secondary | ICD-10-CM

## 2020-12-11 DIAGNOSIS — E039 Hypothyroidism, unspecified: Secondary | ICD-10-CM

## 2020-12-11 DIAGNOSIS — F17210 Nicotine dependence, cigarettes, uncomplicated: Secondary | ICD-10-CM

## 2020-12-31 NOTE — Telephone Encounter
Patient's wife, Aaron Fuentes called with concerns of the patient's resting heart rate being increased (HR 112 & 114) while at cardiac rehab. Aaron Fuentes states when he saw Dr. Erling Cruz on 12/11/20 in the office she discussed this with him and patient had an EKG. EKG was interpreted as Sinus tach at that time. The wife and daughter of the patient are concerned the patient may have afib or other arhythmia and would like to know if he could be ordered to wear a heart monitor.     Will route to Dr. Erling Cruz for his recommendations.

## 2021-03-21 ENCOUNTER — Encounter: Admit: 2021-03-21 | Discharge: 2021-03-21 | Payer: MEDICARE

## 2021-03-21 DIAGNOSIS — R000 Tachycardia, unspecified: Secondary | ICD-10-CM

## 2021-03-21 DIAGNOSIS — I10 Essential (primary) hypertension: Secondary | ICD-10-CM

## 2021-03-21 DIAGNOSIS — I2510 Atherosclerotic heart disease of native coronary artery without angina pectoris: Secondary | ICD-10-CM

## 2021-03-21 DIAGNOSIS — R002 Palpitations: Secondary | ICD-10-CM

## 2021-03-21 DIAGNOSIS — E782 Mixed hyperlipidemia: Secondary | ICD-10-CM

## 2021-03-21 NOTE — Telephone Encounter
Patient's wife called to report that patient is going to see his pulmonologist on 03/28/21. She states that the pulmonologist has made comments about maybe doing a bronchoscopy, but wanted patient to see Dr. Erling Cruz first regarding heart rates. Wife states that patient's heart rates run anywhere from 31s to 40s. She states that he hasn't made many complaints of any symptoms, but she states that these episodes have been ongoing and they are wanting to get answers.      Back in February Dr. Erling Cruz recommended patient to have low dose Metoprolol and a 24-48 hour monitor to see what patient's heart rate is doing. At that time the patient and wife refused both of these after fixing Larry's oxygen concentrator. Patient's wife is wanting to know if we can order this Holter monitor now for patient since he is still having these episodes of fast heart rates.       Will route to [facility] for review and recommendations.

## 2021-03-21 NOTE — Telephone Encounter
Love, Huntley Dec, MD  Claretta Fraise, RN  Caller: Unspecified (Today, 10:20 AM)  Yes. Lets go ahead and get a 3-day Holter monitor prior to the visit. Thanks Merleen Nicely!         Discussed recommendations with patient's wife. She is agreeable to care plan. Patient will be at [place] office at 2:44QK for monitor application.

## 2021-03-22 ENCOUNTER — Encounter: Admit: 2021-03-22 | Discharge: 2021-03-22 | Payer: MEDICARE

## 2021-03-22 ENCOUNTER — Ambulatory Visit: Admit: 2021-03-22 | Discharge: 2021-03-22 | Payer: MEDICARE

## 2021-03-22 DIAGNOSIS — R002 Palpitations: Secondary | ICD-10-CM

## 2021-03-22 DIAGNOSIS — R000 Tachycardia, unspecified: Secondary | ICD-10-CM

## 2021-03-22 DIAGNOSIS — I10 Essential (primary) hypertension: Secondary | ICD-10-CM

## 2021-03-22 DIAGNOSIS — I2510 Atherosclerotic heart disease of native coronary artery without angina pectoris: Secondary | ICD-10-CM

## 2021-03-22 DIAGNOSIS — E782 Mixed hyperlipidemia: Secondary | ICD-10-CM

## 2021-03-22 NOTE — Progress Notes
Ambulatory (External) Cardiac Monitor Placement Record    Patient was seen today for placement of an ambulatory cardiac monitor.        Brand: [employer] (CardioNet)  Serial Number: [id number]  Location where monitor was placed:  Clinic  Start Time and Date: 03/22/21 1:24 PM  Will Holter be returned by mail? No  Patient instructed to contact company phone number on the monitor box with questions regarding billing, placement, troubleshooting.     Dorina Hoyer

## 2021-03-29 ENCOUNTER — Encounter: Admit: 2021-03-29 | Discharge: 2021-03-29 | Payer: MEDICARE

## 2021-04-08 ENCOUNTER — Encounter: Admit: 2021-04-08 | Discharge: 2021-04-08 | Payer: MEDICARE

## 2021-04-09 ENCOUNTER — Encounter: Admit: 2021-04-09 | Discharge: 2021-04-09 | Payer: MEDICARE

## 2021-04-09 DIAGNOSIS — I219 Acute myocardial infarction, unspecified: Secondary | ICD-10-CM

## 2021-04-09 DIAGNOSIS — I10 Essential (primary) hypertension: Secondary | ICD-10-CM

## 2021-04-09 DIAGNOSIS — J449 Chronic obstructive pulmonary disease, unspecified: Secondary | ICD-10-CM

## 2021-04-09 DIAGNOSIS — I739 Peripheral vascular disease, unspecified: Secondary | ICD-10-CM

## 2021-04-09 DIAGNOSIS — I2510 Atherosclerotic heart disease of native coronary artery without angina pectoris: Secondary | ICD-10-CM

## 2021-04-09 DIAGNOSIS — L859 Epidermal thickening, unspecified: Secondary | ICD-10-CM

## 2021-04-09 DIAGNOSIS — Z87891 Personal history of nicotine dependence: Secondary | ICD-10-CM

## 2021-04-09 DIAGNOSIS — R4189 Other symptoms and signs involving cognitive functions and awareness: Secondary | ICD-10-CM

## 2021-04-09 DIAGNOSIS — R413 Other amnesia: Secondary | ICD-10-CM

## 2021-04-09 DIAGNOSIS — E7800 Pure hypercholesterolemia, unspecified: Secondary | ICD-10-CM

## 2021-04-09 DIAGNOSIS — C61 Malignant neoplasm of prostate: Secondary | ICD-10-CM

## 2021-04-09 DIAGNOSIS — J984 Other disorders of lung: Secondary | ICD-10-CM

## 2021-04-09 DIAGNOSIS — M1990 Unspecified osteoarthritis, unspecified site: Secondary | ICD-10-CM

## 2021-04-09 DIAGNOSIS — K1321 Leukoplakia of oral mucosa, including tongue: Secondary | ICD-10-CM

## 2021-04-09 DIAGNOSIS — F17210 Nicotine dependence, cigarettes, uncomplicated: Secondary | ICD-10-CM

## 2021-04-09 DIAGNOSIS — Z8249 Family history of ischemic heart disease and other diseases of the circulatory system: Secondary | ICD-10-CM

## 2021-04-09 DIAGNOSIS — E039 Hypothyroidism, unspecified: Secondary | ICD-10-CM

## 2021-04-09 DIAGNOSIS — Z974 Presence of external hearing-aid: Secondary | ICD-10-CM

## 2021-04-09 DIAGNOSIS — E785 Hyperlipidemia, unspecified: Secondary | ICD-10-CM

## 2021-04-10 ENCOUNTER — Encounter: Admit: 2021-04-10 | Discharge: 2021-04-10 | Payer: MEDICARE

## 2021-07-18 DEATH — deceased

## 2022-04-26 IMAGING — CT BRAIN WO(Adult)
3 of 4 series · 14 of 47 positions shown, 16 images · non-contrast
Comparison: none

[Series 4: brain cor 5.00 hr40 s3 · coronal · 0.31mm/px · 3 of 41 slices shown]
[im 14/41  brain]
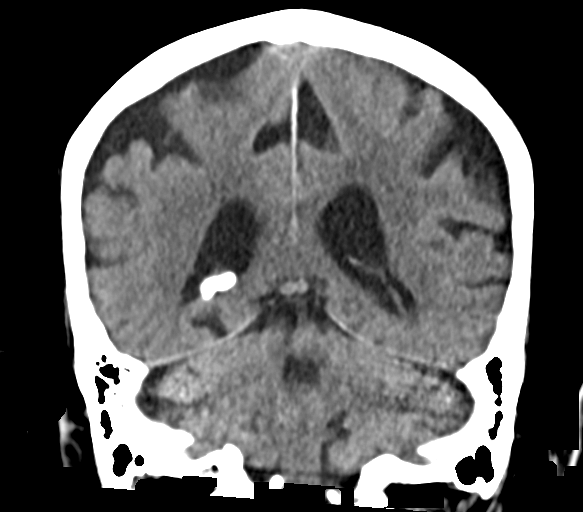
[im 18/41  brain]
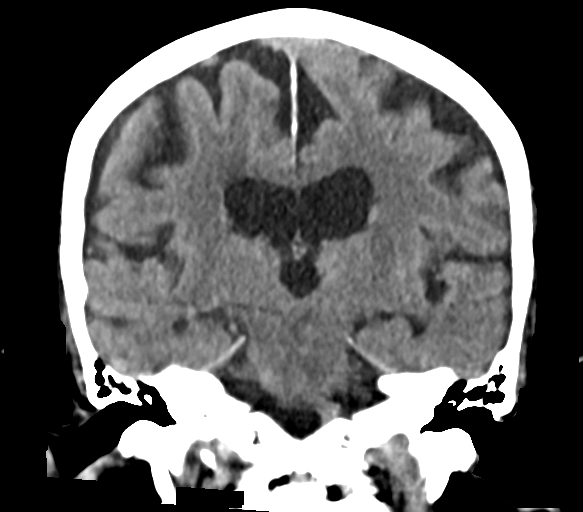
[im 23/41  brain]
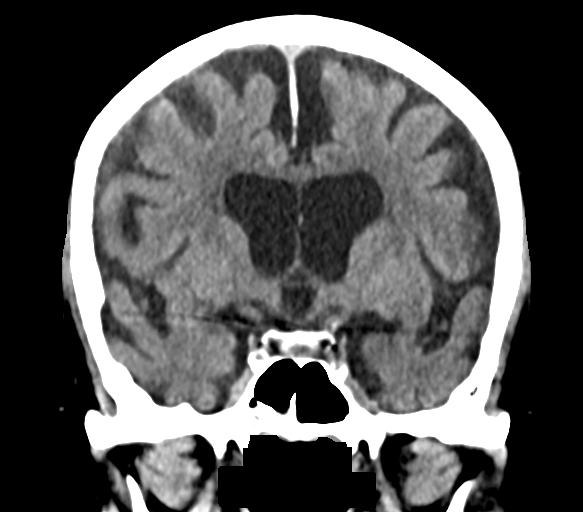

[Series 6: brain sag 5.00 hr40 s3 · sagittal · 0.31mm/px · 3 of 35 slices shown]
[im 12/35  brain]
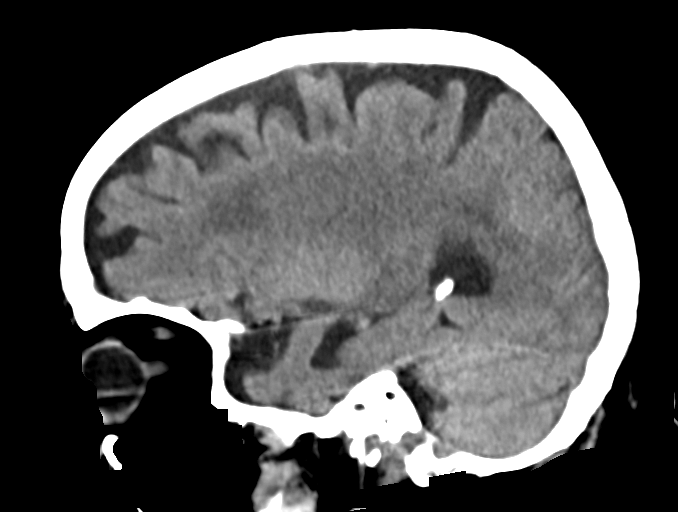
[im 18/35  brain]
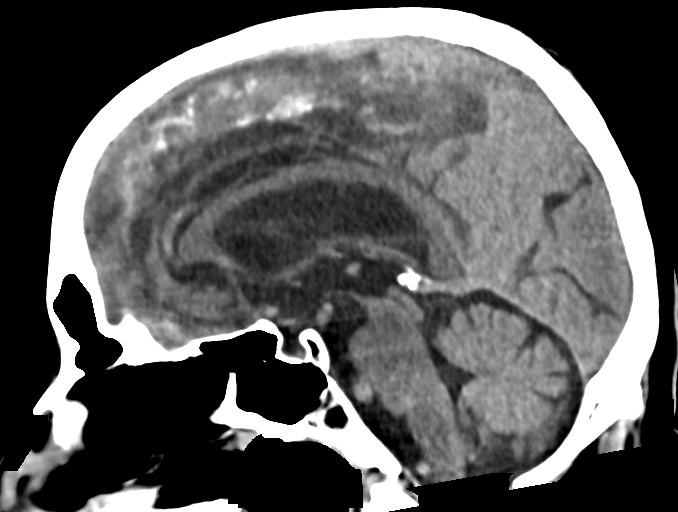
[im 23/35  brain]
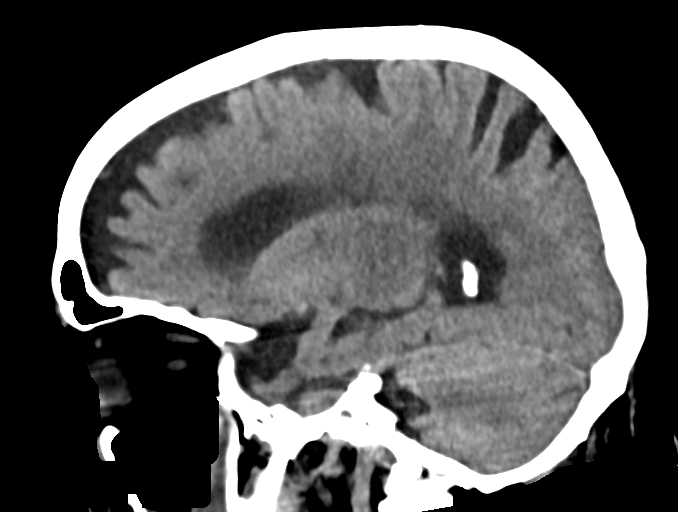

[Series 8: brain ax 2.00 hr60 s3 · axial · 0.35mm/px · z∈[-517,-395]mm · 8 of 77 slices shown, 10 images]
[im 8/77  brain]
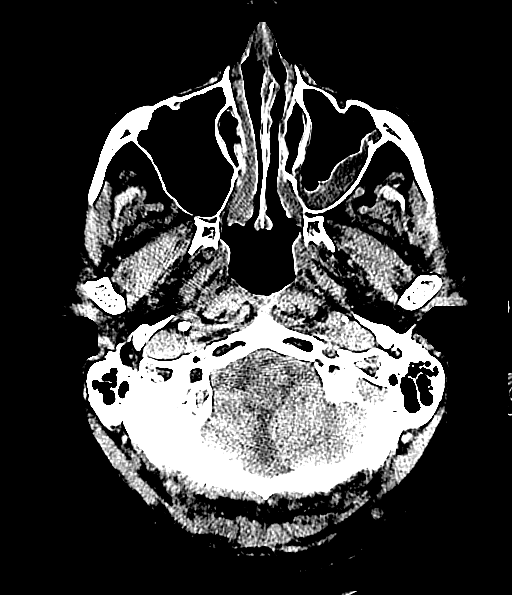
[im 8/77  bone]
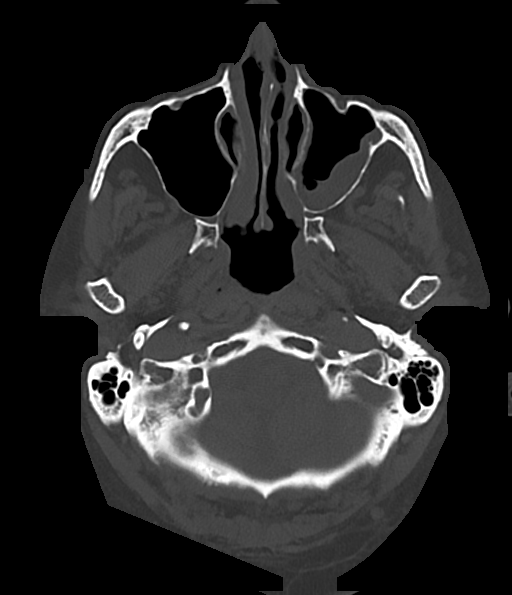
[im 15/77  brain]
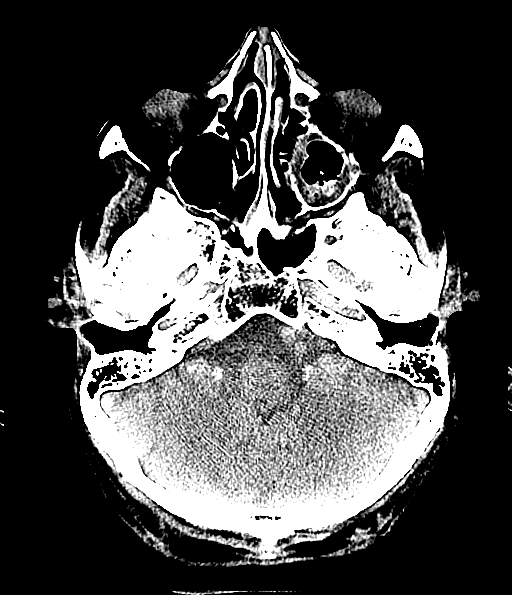
[im 26/77  brain]
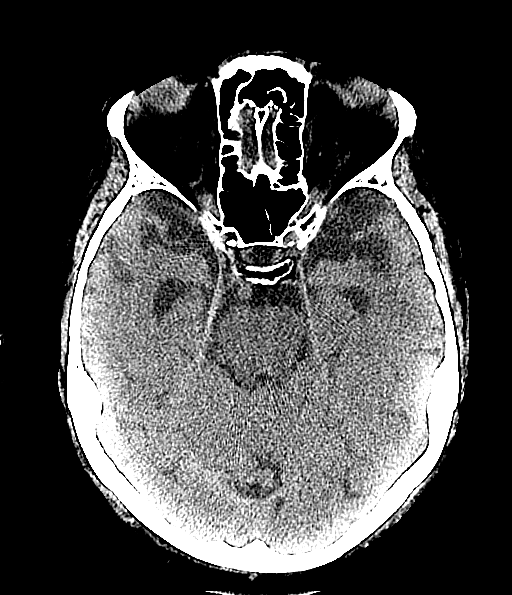
[im 33/77  brain]
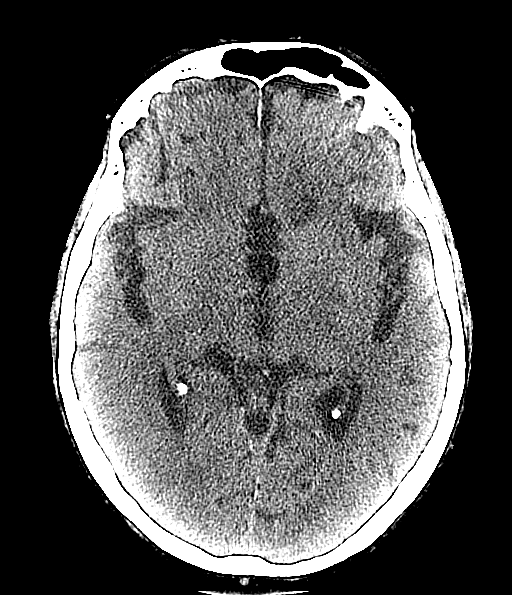
[im 44/77  brain]
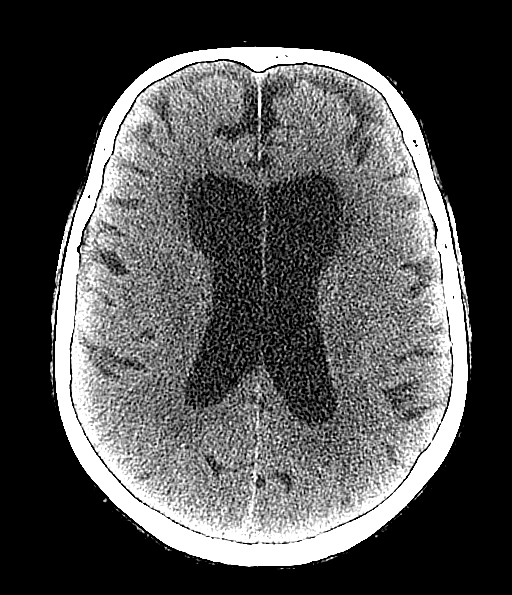
[im 44/77  bone]
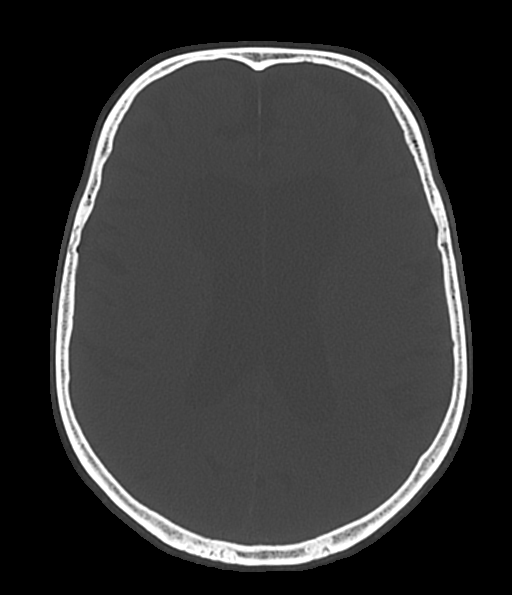
[im 51/77  brain]
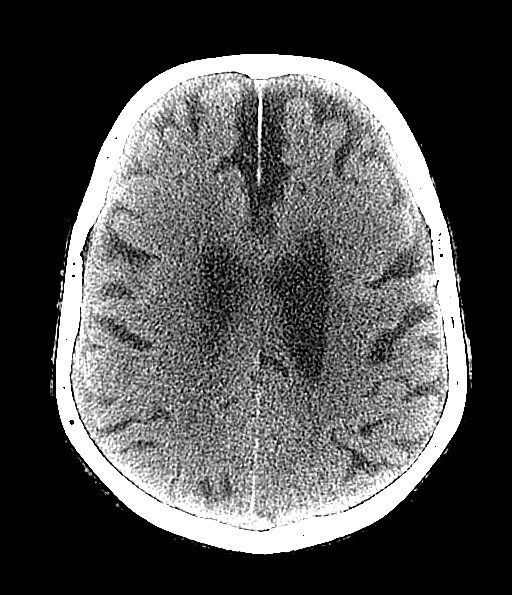
[im 62/77  brain]
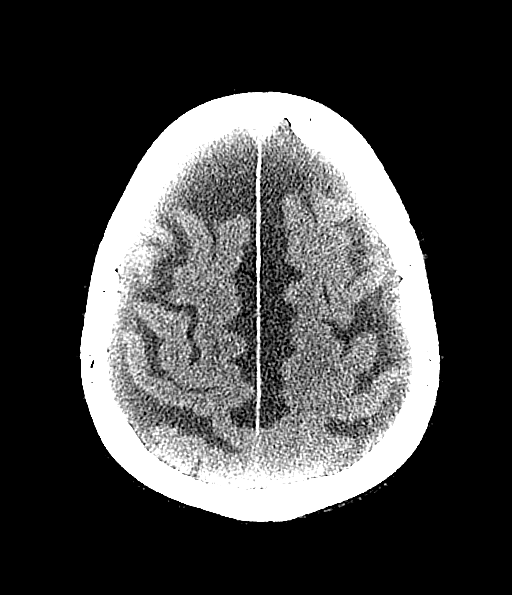
[im 69/77  brain]
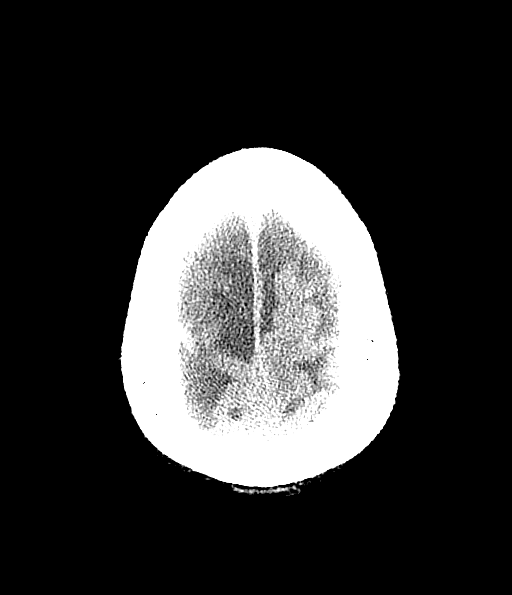

[14 of 47 positions shown; findings below may reference images not displayed]

DIAGNOSTIC STUDIES

EXAM

CT head without contrast.

INDICATION

fall trauma
fell and hit left side of forehead yesterday, redness and swelling    ct/nm 0/0

TECHNIQUE

All CT scans at this facility use dose modulation, iterative reconstruction, and/or weight based
dosing when appropriate to reduce radiation dose to as low as reasonably achievable.

Number of previous computed tomography exams in the last 12 months is 0  .

Number of previous nuclear medicine myocardial perfusion studies in the last 12 months is 0  .

COMPARISONS

Compared with previous MRI of the brain dated 11/30/2017.

FINDINGS

No acute intracranial hemorrhage is identified. No acute abnormal extra-axial fluid collections
are identified. No acute territorial infarct is identified. Chronic small vessel ischemic changes
are again noted with areas of decreased attenuation involving the periventricular and subcortical
white matter with the overall degree of white-matter changes better demonstrated on the previous MRI
study. Similar appearing moderate generalized cerebral atrophy with related widening of the extra-
axial spaces and ventricles. The ventricles appear stable in size and configuration. No midline
shift is identified. The calvarium appears intact. Intracranial atherosclerotic vascular
calcifications are noted. The mastoid air cells appear clear bilaterally. Chronic appearing
circumferential mucosal thickening involving the left maxillary sinus with additional more patchy
areas of opacification in the posterior left maxillary sinus which may indicate acute on chronic
left maxillary sinusitis which has developed since the previous MRI study dated 11/30/2017. The
remainder of the paranasal sinuses appear clear. Mild left frontal soft tissue swelling.

IMPRESSION
1. No acute intracranial abnormalities identified.
2. Mild left frontal soft tissue swelling without an acute underlying fracture of the calvarium
identified.
3. Stable appearing chronic small vessel ischemic changes and generalized age-related cerebral
atrophy.
4. Findings suggesting acute on chronic left maxillary sinusitis.

Tech Notes:

fell and hit left side of forehead yesterday, redness and swelling

ct/nm 0/0

## 2023-06-04 IMAGING — CR [ID]
3 series · 3 of 3 positions shown · non-contrast
Comparison: none

[chest lat (1 of 2)]
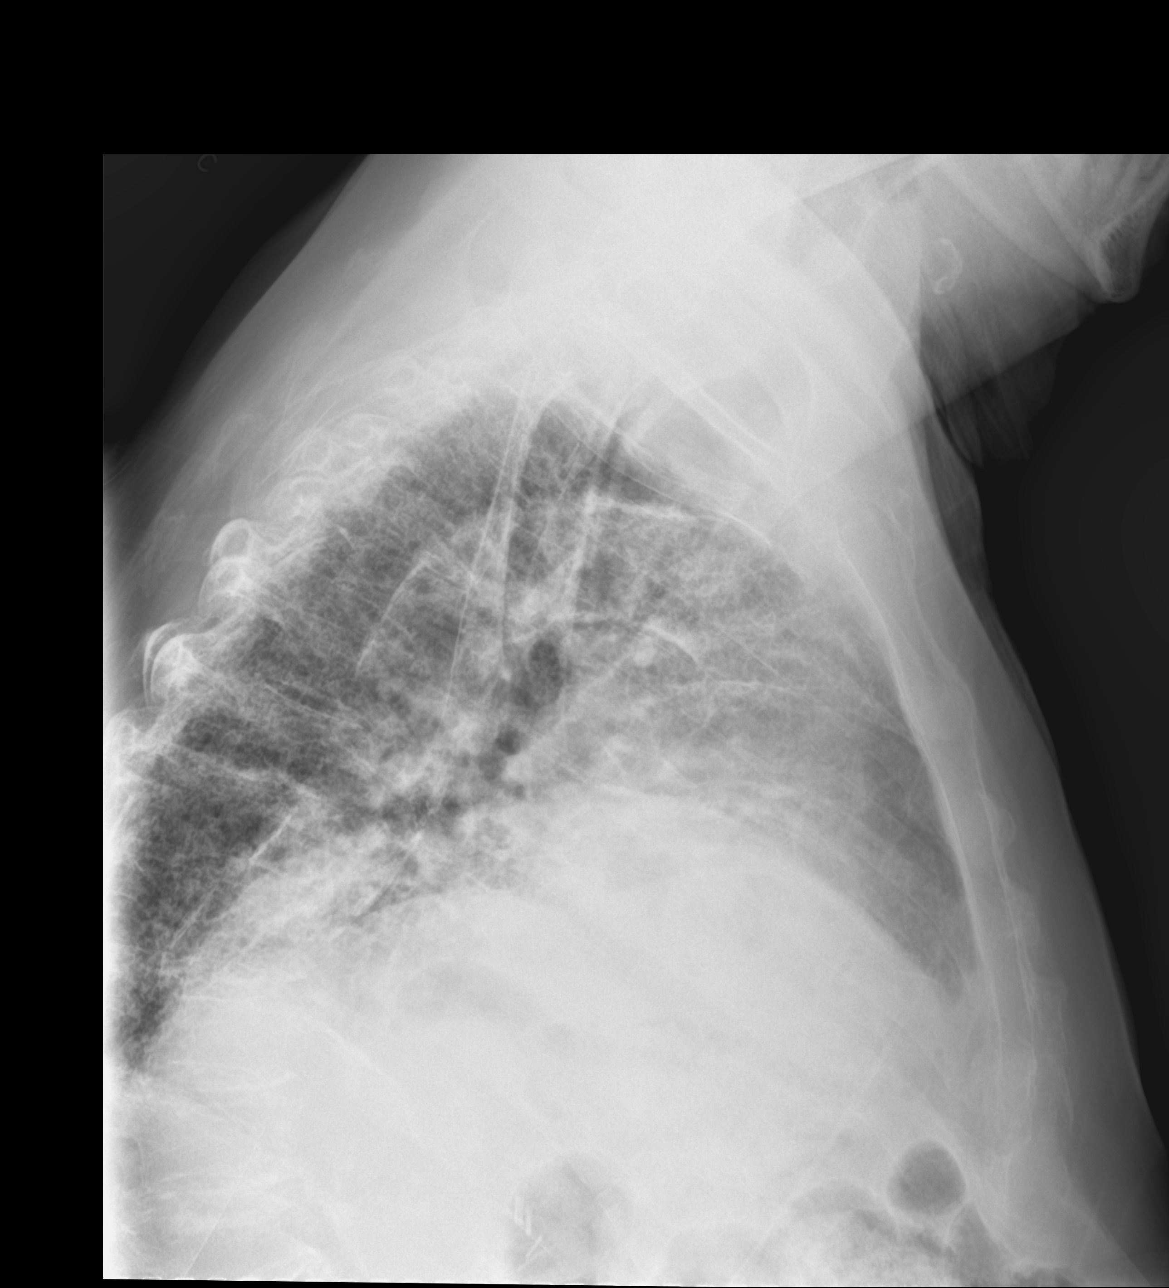

[chest lat (2 of 2)]
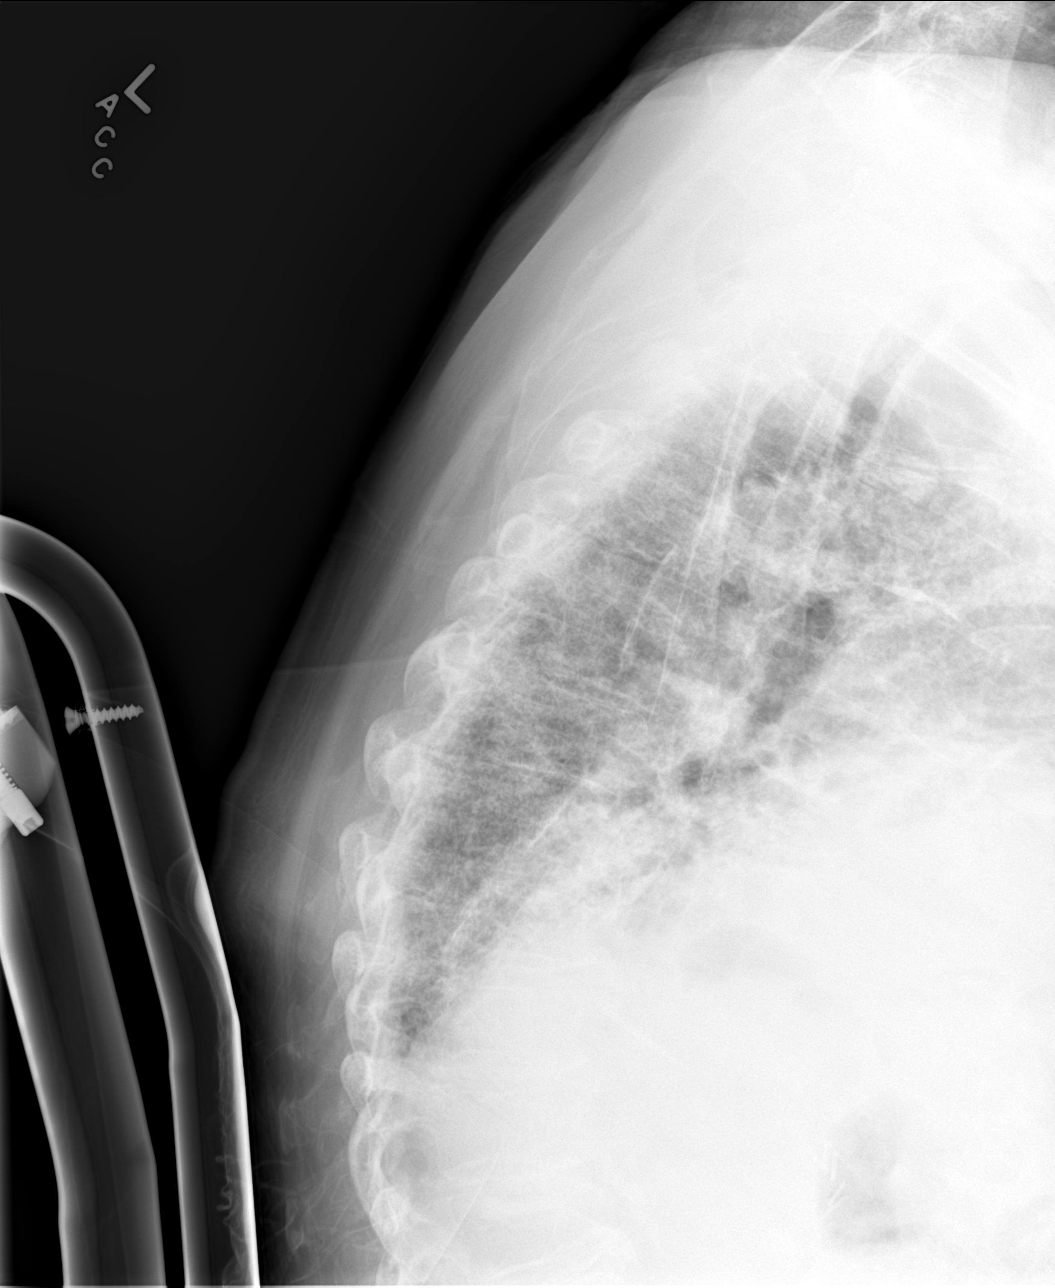

[chest ap]
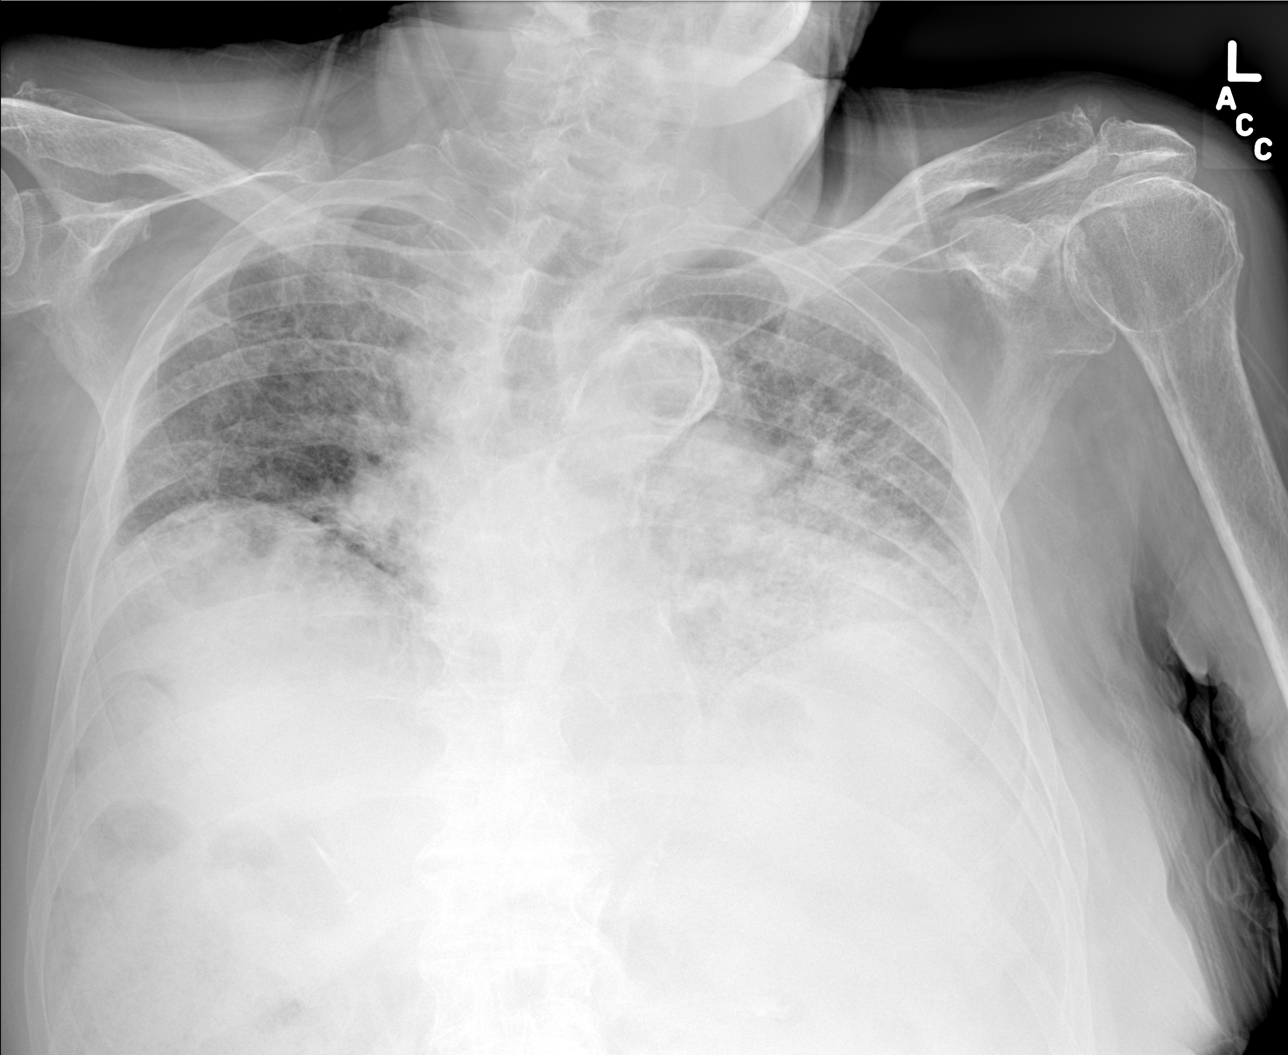

[3 of 3 positions shown; findings below may reference images not displayed]

DIAGNOSTIC STUDIES

EXAM

TWO VIEW CHEST

INDICATION

soa
Shortness of Air

TECHNIQUE

PA and lateral chest views

COMPARISONS

02/20/2020 and 11/17/2019

FINDINGS

Films taken with shallow inspiratory effort.

The aorta is elongated and is tortuous and calcified. The cardiac silhouette is enlarged.

Lung markings are diffusely increased left greater than right. Differential includes asymmetric
infiltrates versus congestion/edema. No pleural effusions are identified.

Bony demineralization and degenerative changes are present.

IMPRESSION

Diffusely increased lung markings, left greater than right. Findings have worsened in comparison to
02/20/2020 and compatible with worsening infiltrates and/or edema.

Tech Notes:

Shortness of Air
